# Patient Record
Sex: Female | Born: 1940 | Race: White | Hispanic: No | Marital: Married | Smoking: Former smoker
Health system: Southern US, Community
[De-identification: ages and names within clinical notes are randomized; demographics above are authoritative.]

## PROBLEM LIST (undated history)

## (undated) DIAGNOSIS — J449 Chronic obstructive pulmonary disease, unspecified: Secondary | ICD-10-CM

## (undated) DIAGNOSIS — M810 Age-related osteoporosis without current pathological fracture: Secondary | ICD-10-CM

## (undated) DIAGNOSIS — R9431 Abnormal electrocardiogram [ECG] [EKG]: Secondary | ICD-10-CM

## (undated) HISTORY — DX: Chronic obstructive pulmonary disease, unspecified: J44.9

## (undated) HISTORY — DX: Age-related osteoporosis without current pathological fracture: M81.0

## (undated) HISTORY — DX: Abnormal electrocardiogram (ECG) (EKG): R94.31

## (undated) HISTORY — PX: LUNG SURGERY: SHX703

---

## 2000-10-25 ENCOUNTER — Encounter: Payer: Self-pay | Admitting: Family Medicine

## 2000-10-25 ENCOUNTER — Ambulatory Visit (HOSPITAL_COMMUNITY): Admission: RE | Admit: 2000-10-25 | Discharge: 2000-10-25 | Payer: Self-pay | Admitting: Unknown Physician Specialty

## 2001-03-04 ENCOUNTER — Encounter: Payer: Self-pay | Admitting: Family Medicine

## 2001-03-04 ENCOUNTER — Ambulatory Visit (HOSPITAL_COMMUNITY): Admission: RE | Admit: 2001-03-04 | Discharge: 2001-03-04 | Payer: Self-pay | Admitting: Family Medicine

## 2002-08-14 ENCOUNTER — Encounter: Payer: Self-pay | Admitting: Family Medicine

## 2002-08-14 ENCOUNTER — Ambulatory Visit (HOSPITAL_COMMUNITY): Admission: RE | Admit: 2002-08-14 | Discharge: 2002-08-14 | Payer: Self-pay | Admitting: Family Medicine

## 2003-10-01 ENCOUNTER — Ambulatory Visit (HOSPITAL_COMMUNITY): Admission: RE | Admit: 2003-10-01 | Discharge: 2003-10-01 | Payer: Self-pay | Admitting: Family Medicine

## 2004-02-07 ENCOUNTER — Ambulatory Visit (HOSPITAL_COMMUNITY): Admission: RE | Admit: 2004-02-07 | Discharge: 2004-02-07 | Payer: Self-pay | Admitting: Family Medicine

## 2004-02-13 ENCOUNTER — Ambulatory Visit (HOSPITAL_COMMUNITY): Admission: RE | Admit: 2004-02-13 | Discharge: 2004-02-13 | Payer: Self-pay | Admitting: Family Medicine

## 2004-03-05 ENCOUNTER — Ambulatory Visit (HOSPITAL_COMMUNITY): Admission: RE | Admit: 2004-03-05 | Discharge: 2004-03-05 | Payer: Self-pay | Admitting: Internal Medicine

## 2004-08-27 ENCOUNTER — Ambulatory Visit (HOSPITAL_COMMUNITY): Admission: RE | Admit: 2004-08-27 | Discharge: 2004-08-27 | Payer: Self-pay | Admitting: Family Medicine

## 2004-11-04 ENCOUNTER — Ambulatory Visit: Payer: Self-pay | Admitting: *Deleted

## 2004-11-05 ENCOUNTER — Ambulatory Visit: Payer: Self-pay | Admitting: Cardiology

## 2004-11-05 ENCOUNTER — Ambulatory Visit (HOSPITAL_COMMUNITY): Admission: RE | Admit: 2004-11-05 | Discharge: 2004-11-05 | Payer: Self-pay | Admitting: *Deleted

## 2004-11-12 ENCOUNTER — Ambulatory Visit: Payer: Self-pay | Admitting: *Deleted

## 2005-03-25 ENCOUNTER — Ambulatory Visit (HOSPITAL_COMMUNITY): Admission: RE | Admit: 2005-03-25 | Discharge: 2005-03-25 | Payer: Self-pay | Admitting: Family Medicine

## 2005-05-14 ENCOUNTER — Ambulatory Visit (HOSPITAL_COMMUNITY): Admission: RE | Admit: 2005-05-14 | Discharge: 2005-05-14 | Payer: Self-pay | Admitting: Neurology

## 2006-03-04 ENCOUNTER — Ambulatory Visit: Payer: Self-pay | Admitting: Internal Medicine

## 2006-03-16 ENCOUNTER — Ambulatory Visit (HOSPITAL_COMMUNITY): Admission: RE | Admit: 2006-03-16 | Discharge: 2006-03-16 | Payer: Self-pay | Admitting: Internal Medicine

## 2006-03-16 ENCOUNTER — Ambulatory Visit: Payer: Self-pay | Admitting: Internal Medicine

## 2006-09-20 ENCOUNTER — Ambulatory Visit (HOSPITAL_COMMUNITY): Admission: RE | Admit: 2006-09-20 | Discharge: 2006-09-20 | Payer: Self-pay | Admitting: Family Medicine

## 2006-09-22 ENCOUNTER — Ambulatory Visit (HOSPITAL_COMMUNITY): Admission: RE | Admit: 2006-09-22 | Discharge: 2006-09-22 | Payer: Self-pay | Admitting: Family Medicine

## 2006-10-13 ENCOUNTER — Ambulatory Visit (HOSPITAL_COMMUNITY): Payer: Self-pay | Admitting: Family Medicine

## 2006-10-13 ENCOUNTER — Encounter (HOSPITAL_COMMUNITY): Admission: RE | Admit: 2006-10-13 | Discharge: 2006-11-12 | Payer: Self-pay | Admitting: Family Medicine

## 2007-01-05 ENCOUNTER — Encounter (HOSPITAL_COMMUNITY): Admission: RE | Admit: 2007-01-05 | Discharge: 2007-02-04 | Payer: Self-pay | Admitting: Family Medicine

## 2007-01-05 ENCOUNTER — Ambulatory Visit (HOSPITAL_COMMUNITY): Payer: Self-pay | Admitting: Family Medicine

## 2007-04-06 ENCOUNTER — Ambulatory Visit (HOSPITAL_COMMUNITY): Payer: Self-pay | Admitting: Family Medicine

## 2007-04-06 ENCOUNTER — Encounter (HOSPITAL_COMMUNITY): Admission: RE | Admit: 2007-04-06 | Discharge: 2007-04-12 | Payer: Self-pay | Admitting: Oncology

## 2007-07-04 ENCOUNTER — Ambulatory Visit (HOSPITAL_COMMUNITY): Payer: Self-pay | Admitting: Family Medicine

## 2007-07-04 ENCOUNTER — Encounter (HOSPITAL_COMMUNITY): Admission: RE | Admit: 2007-07-04 | Discharge: 2007-07-13 | Payer: Self-pay | Admitting: Family Medicine

## 2007-10-05 ENCOUNTER — Ambulatory Visit (HOSPITAL_COMMUNITY): Payer: Self-pay | Admitting: Family Medicine

## 2007-10-05 ENCOUNTER — Encounter (HOSPITAL_COMMUNITY): Admission: RE | Admit: 2007-10-05 | Discharge: 2007-11-04 | Payer: Self-pay | Admitting: Family Medicine

## 2007-11-09 ENCOUNTER — Ambulatory Visit (HOSPITAL_COMMUNITY): Admission: RE | Admit: 2007-11-09 | Discharge: 2007-11-09 | Payer: Self-pay | Admitting: Family Medicine

## 2008-01-05 ENCOUNTER — Encounter (HOSPITAL_COMMUNITY): Admission: RE | Admit: 2008-01-05 | Discharge: 2008-02-04 | Payer: Self-pay | Admitting: Oncology

## 2008-01-05 ENCOUNTER — Ambulatory Visit (HOSPITAL_COMMUNITY): Payer: Self-pay | Admitting: Family Medicine

## 2008-02-28 ENCOUNTER — Ambulatory Visit (HOSPITAL_COMMUNITY): Admission: RE | Admit: 2008-02-28 | Discharge: 2008-02-28 | Payer: Self-pay | Admitting: Family Medicine

## 2008-04-05 ENCOUNTER — Encounter (HOSPITAL_COMMUNITY): Admission: RE | Admit: 2008-04-05 | Discharge: 2008-05-05 | Payer: Self-pay | Admitting: Family Medicine

## 2008-04-12 ENCOUNTER — Ambulatory Visit (HOSPITAL_COMMUNITY): Payer: Self-pay | Admitting: Family Medicine

## 2008-08-09 ENCOUNTER — Encounter (HOSPITAL_COMMUNITY): Admission: RE | Admit: 2008-08-09 | Discharge: 2008-09-08 | Payer: Self-pay | Admitting: Family Medicine

## 2008-08-09 ENCOUNTER — Ambulatory Visit (HOSPITAL_COMMUNITY): Payer: Self-pay | Admitting: Family Medicine

## 2008-09-06 ENCOUNTER — Ambulatory Visit (HOSPITAL_COMMUNITY): Admission: RE | Admit: 2008-09-06 | Discharge: 2008-09-06 | Payer: Self-pay | Admitting: Family Medicine

## 2008-10-03 ENCOUNTER — Ambulatory Visit (HOSPITAL_COMMUNITY): Admission: RE | Admit: 2008-10-03 | Discharge: 2008-10-03 | Payer: Self-pay | Admitting: Family Medicine

## 2008-11-08 ENCOUNTER — Ambulatory Visit (HOSPITAL_COMMUNITY): Payer: Self-pay | Admitting: Family Medicine

## 2008-11-08 ENCOUNTER — Encounter (HOSPITAL_COMMUNITY): Admission: RE | Admit: 2008-11-08 | Discharge: 2008-12-08 | Payer: Self-pay | Admitting: Family Medicine

## 2009-01-31 ENCOUNTER — Encounter (HOSPITAL_COMMUNITY): Admission: RE | Admit: 2009-01-31 | Discharge: 2009-03-02 | Payer: Self-pay | Admitting: Family Medicine

## 2009-01-31 ENCOUNTER — Ambulatory Visit (HOSPITAL_COMMUNITY): Payer: Self-pay | Admitting: Family Medicine

## 2009-03-13 ENCOUNTER — Ambulatory Visit (HOSPITAL_COMMUNITY): Admission: RE | Admit: 2009-03-13 | Discharge: 2009-03-13 | Payer: Self-pay | Admitting: Family Medicine

## 2009-05-02 ENCOUNTER — Ambulatory Visit (HOSPITAL_COMMUNITY): Payer: Self-pay | Admitting: Family Medicine

## 2009-05-02 ENCOUNTER — Encounter (HOSPITAL_COMMUNITY): Admission: RE | Admit: 2009-05-02 | Discharge: 2009-06-01 | Payer: Self-pay | Admitting: Family Medicine

## 2009-07-01 ENCOUNTER — Ambulatory Visit (HOSPITAL_COMMUNITY): Admission: RE | Admit: 2009-07-01 | Discharge: 2009-07-01 | Payer: Self-pay | Admitting: Family Medicine

## 2009-08-01 ENCOUNTER — Encounter (HOSPITAL_COMMUNITY): Admission: RE | Admit: 2009-08-01 | Discharge: 2009-08-31 | Payer: Self-pay | Admitting: Family Medicine

## 2009-08-01 ENCOUNTER — Ambulatory Visit (HOSPITAL_COMMUNITY): Payer: Self-pay | Admitting: Family Medicine

## 2009-09-10 ENCOUNTER — Ambulatory Visit (HOSPITAL_COMMUNITY): Admission: RE | Admit: 2009-09-10 | Discharge: 2009-09-10 | Payer: Self-pay | Admitting: Family Medicine

## 2009-10-31 ENCOUNTER — Encounter (HOSPITAL_COMMUNITY): Admission: RE | Admit: 2009-10-31 | Discharge: 2009-11-30 | Payer: Self-pay | Admitting: Family Medicine

## 2009-10-31 ENCOUNTER — Ambulatory Visit (HOSPITAL_COMMUNITY): Payer: Self-pay | Admitting: Family Medicine

## 2009-12-30 ENCOUNTER — Ambulatory Visit (HOSPITAL_COMMUNITY): Admission: RE | Admit: 2009-12-30 | Discharge: 2009-12-30 | Payer: Self-pay | Admitting: Family Medicine

## 2010-01-30 ENCOUNTER — Encounter (HOSPITAL_COMMUNITY): Admission: RE | Admit: 2010-01-30 | Discharge: 2010-03-01 | Payer: Self-pay | Admitting: Family Medicine

## 2010-01-30 ENCOUNTER — Ambulatory Visit (HOSPITAL_COMMUNITY): Payer: Self-pay | Admitting: Family Medicine

## 2010-05-01 ENCOUNTER — Encounter (HOSPITAL_COMMUNITY)
Admission: RE | Admit: 2010-05-01 | Discharge: 2010-05-31 | Payer: Self-pay | Source: Home / Self Care | Admitting: Family Medicine

## 2010-05-01 ENCOUNTER — Ambulatory Visit (HOSPITAL_COMMUNITY): Payer: Self-pay | Admitting: Family Medicine

## 2010-07-08 ENCOUNTER — Encounter: Payer: Self-pay | Admitting: Cardiovascular Disease

## 2010-07-31 ENCOUNTER — Ambulatory Visit (HOSPITAL_COMMUNITY)
Admission: RE | Admit: 2010-07-31 | Discharge: 2010-08-12 | Payer: Self-pay | Source: Home / Self Care | Attending: Family Medicine | Admitting: Family Medicine

## 2010-07-31 ENCOUNTER — Encounter (HOSPITAL_COMMUNITY)
Admission: RE | Admit: 2010-07-31 | Discharge: 2010-08-12 | Payer: Self-pay | Source: Home / Self Care | Attending: Family Medicine | Admitting: Family Medicine

## 2010-08-02 ENCOUNTER — Encounter (INDEPENDENT_AMBULATORY_CARE_PROVIDER_SITE_OTHER): Payer: Self-pay | Admitting: Internal Medicine

## 2010-10-28 ENCOUNTER — Other Ambulatory Visit: Payer: Self-pay | Admitting: *Deleted

## 2010-10-28 ENCOUNTER — Other Ambulatory Visit (HOSPITAL_COMMUNITY): Payer: Self-pay | Admitting: Family Medicine

## 2010-10-28 ENCOUNTER — Ambulatory Visit (HOSPITAL_COMMUNITY)
Admission: RE | Admit: 2010-10-28 | Discharge: 2010-10-28 | Disposition: A | Discharge status: Home / Self Care | Payer: Medicare Other | Source: Ambulatory Visit | Attending: Family Medicine | Admitting: Family Medicine

## 2010-10-28 DIAGNOSIS — J449 Chronic obstructive pulmonary disease, unspecified: Secondary | ICD-10-CM | POA: Insufficient documentation

## 2010-10-28 DIAGNOSIS — J4489 Other specified chronic obstructive pulmonary disease: Secondary | ICD-10-CM | POA: Insufficient documentation

## 2010-10-28 DIAGNOSIS — J40 Bronchitis, not specified as acute or chronic: Secondary | ICD-10-CM

## 2010-10-28 DIAGNOSIS — R0602 Shortness of breath: Secondary | ICD-10-CM | POA: Insufficient documentation

## 2010-10-28 LAB — BLOOD GAS, ARTERIAL
Acid-Base Excess: 1.1 mmol/L (ref 0.0–2.0)
Bicarbonate: 25 mEq/L — ABNORMAL HIGH (ref 20.0–24.0)
Patient temperature: 37
TCO2: 21.6 mmol/L (ref 0–100)
pCO2 arterial: 37.9 mmHg (ref 35.0–45.0)
pO2, Arterial: 68.1 mmHg — ABNORMAL LOW (ref 80.0–100.0)

## 2010-10-30 ENCOUNTER — Ambulatory Visit (HOSPITAL_COMMUNITY): Payer: Medicare Other

## 2010-10-30 ENCOUNTER — Other Ambulatory Visit (HOSPITAL_COMMUNITY): Payer: Self-pay | Admitting: Family Medicine

## 2010-10-30 ENCOUNTER — Encounter (HOSPITAL_COMMUNITY): Payer: Medicare Other

## 2010-10-30 DIAGNOSIS — R05 Cough: Secondary | ICD-10-CM

## 2010-10-30 DIAGNOSIS — J449 Chronic obstructive pulmonary disease, unspecified: Secondary | ICD-10-CM

## 2010-11-06 ENCOUNTER — Encounter (HOSPITAL_COMMUNITY): Payer: Medicare Other | Attending: Family Medicine

## 2010-11-06 ENCOUNTER — Encounter (HOSPITAL_COMMUNITY): Payer: Medicare Other

## 2010-11-06 DIAGNOSIS — M818 Other osteoporosis without current pathological fracture: Secondary | ICD-10-CM | POA: Insufficient documentation

## 2010-11-07 ENCOUNTER — Ambulatory Visit (HOSPITAL_COMMUNITY)
Admission: RE | Admit: 2010-11-07 | Discharge: 2010-11-07 | Disposition: A | Discharge status: Home / Self Care | Payer: Medicare Other | Source: Ambulatory Visit | Attending: Family Medicine | Admitting: Family Medicine

## 2010-11-07 DIAGNOSIS — J4489 Other specified chronic obstructive pulmonary disease: Secondary | ICD-10-CM | POA: Insufficient documentation

## 2010-11-07 DIAGNOSIS — R05 Cough: Secondary | ICD-10-CM | POA: Insufficient documentation

## 2010-11-07 DIAGNOSIS — R059 Cough, unspecified: Secondary | ICD-10-CM | POA: Insufficient documentation

## 2010-11-07 DIAGNOSIS — J449 Chronic obstructive pulmonary disease, unspecified: Secondary | ICD-10-CM

## 2010-11-13 ENCOUNTER — Inpatient Hospital Stay (HOSPITAL_COMMUNITY)
Admission: RE | Admit: 2010-11-13 | Discharge: 2010-11-13 | Disposition: A | Discharge status: Home / Self Care | Payer: Medicare Other | Source: Ambulatory Visit | Attending: Family Medicine | Admitting: Family Medicine

## 2010-11-13 ENCOUNTER — Encounter: Payer: Self-pay | Admitting: Cardiovascular Disease

## 2010-11-17 ENCOUNTER — Encounter: Payer: Self-pay | Admitting: Cardiovascular Disease

## 2010-11-20 ENCOUNTER — Encounter: Payer: Self-pay | Admitting: Cardiovascular Disease

## 2010-11-21 ENCOUNTER — Encounter: Payer: Self-pay | Admitting: Cardiovascular Disease

## 2010-11-21 ENCOUNTER — Ambulatory Visit (INDEPENDENT_AMBULATORY_CARE_PROVIDER_SITE_OTHER): Payer: Medicare Other | Admitting: Cardiovascular Disease

## 2010-11-21 DIAGNOSIS — R9431 Abnormal electrocardiogram [ECG] [EKG]: Secondary | ICD-10-CM | POA: Insufficient documentation

## 2010-11-21 DIAGNOSIS — R0600 Dyspnea, unspecified: Secondary | ICD-10-CM

## 2010-11-21 DIAGNOSIS — R079 Chest pain, unspecified: Secondary | ICD-10-CM | POA: Insufficient documentation

## 2010-11-21 DIAGNOSIS — R0989 Other specified symptoms and signs involving the circulatory and respiratory systems: Secondary | ICD-10-CM

## 2010-11-21 NOTE — Patient Instructions (Signed)
Your physician has requested that you have an echocardiogram. Echocardiography is a painless test that uses sound waves to create images of your heart. It provides your doctor with information about the size and shape of your heart and how well your heart's chambers and valves are working. This procedure takes approximately one hour. There are no restrictions for this procedure.SCHEDULE AT [facility]   Your physician has requested that you have a lexiscan myoview. For further information please visit [email or web address] Please follow instruction sheet, as given.SCHEDULE AT [facility]

## 2010-11-21 NOTE — Assessment & Plan Note (Signed)
Progressive COPD likely etiology of symptoms.  R/O pulmonary htn with echo.  She has expressed interest in DNR and never being intubated.

## 2010-11-21 NOTE — Progress Notes (Signed)
70 yo with progressive dyspnea over the last year.  Severe emphysema with progression of parenchymal lung disease by recent CT.  Two episodes of SSCP at rest last two weeks.  Lasted about 10 minutes and associated with dyspnea.  Has not had previous cardiac disease or evidence of pulmonary HTN.  Has had cough but no fever or sputum.  No PND or othopnea and no edema or signs of CHF.  Does not feel that her oxygen or lung medicine is working as well.  Needs 3L/min with ambulation and wearing oxygen all day and night now.    ROS: Denies fever, malais, weight loss, blurry vision, decreased visual acuity, cough, sputum, SOB, hemoptysis, pleuritic pain, palpitaitons, heartburn, abdominal pain, melena, lower extremity edema, claudication, or rash.   General: Affect appropriate Healthy:  appears stated age HEENT: normal Neck supple with no adenopathy JVP normal no bruits no thyromegaly Lungs clear with no wheezing and good diaphragmatic motion Heart:  S1/S2 no murmur,rub, gallop or click PMI normal Abdomen: benighn, BS positve, no tenderness, no AAA no bruit.  No HSM or HJR Distal pulses intact with no bruits No edema Neuro non-focal Skin warm and dry No muscular weakness  Medications Current Outpatient Prescriptions  Medication Sig Dispense Refill  . Acetaminophen-Pamabrom (WOMENS TYLENOL PO) Take by mouth as needed.        Marland Kitchen albuterol (PROVENTIL) 90 MCG/ACT inhaler Inhale 2 puffs into the lungs.        . ALPRAZolam (XANAX) 0.5 MG tablet Take 0.5 mg by mouth at bedtime as needed.        Marland Kitchen atorvastatin (LIPITOR) 10 MG tablet Take 10 mg by mouth daily.        . budesonide-formoterol (SYMBICORT) 160-4.5 MCG/ACT inhaler Inhale 2 puffs into the lungs 2 (two) times daily.        . Calcium Carbonate (CALTRATE 600 PO) Take by mouth as needed.        . citalopram (CELEXA) 20 MG tablet 1/2 tab po qd       . cyclobenzaprine (FLEXERIL) 10 MG tablet Take 5 mg by mouth at bedtime as needed.        .  Ibandronate Sodium (BONIVA IV) Inject into the vein every 3 (three) months.        . magnesium oxide (MAG-OX) 400 MG tablet Take 400 mg by mouth daily.        . Omeprazole Magnesium (PRILOSEC OTC PO) 1 tab po qd      . tiotropium (SPIRIVA) 18 MCG inhalation capsule Place 18 mcg into inhaler and inhale daily.          Allergies Hydrocodone; Suprax; Triptans; and Zocor  Family History: Family History  Problem Relation Age of Onset  . Colon cancer      grandma  . Hypertension Mother     Social History: History   Social History  . Marital Status: Married    Spouse Name: N/A    Number of Children: 4  . Years of Education: N/A   Occupational History  . Not on file.   Social History Main Topics  . Smoking status: Former Games developer  . Smokeless tobacco: Never Used  . Alcohol Use: No  . Drug Use: No  . Sexually Active: Not on file   Other Topics Concern  . Not on file   Social History Narrative  . No narrative on file    Electrocardiogram:  NSR IVCD LAD  Assessment and Plan

## 2010-11-21 NOTE — Assessment & Plan Note (Signed)
Not likely angina.  F/U [employer]

## 2010-11-21 NOTE — Assessment & Plan Note (Signed)
Check echo.  Doubt ischemia.  No significant heart block

## 2010-11-24 ENCOUNTER — Other Ambulatory Visit: Payer: Self-pay | Admitting: Cardiovascular Disease

## 2010-11-25 ENCOUNTER — Encounter (HOSPITAL_COMMUNITY): Payer: Self-pay

## 2010-11-25 ENCOUNTER — Encounter (HOSPITAL_COMMUNITY): Payer: Medicare Other

## 2010-11-25 ENCOUNTER — Encounter (HOSPITAL_COMMUNITY)
Admission: RE | Admit: 2010-11-25 | Discharge: 2010-11-25 | Disposition: A | Discharge status: Home / Self Care | Payer: Medicare Other | Source: Ambulatory Visit | Attending: Cardiovascular Disease | Admitting: Cardiovascular Disease

## 2010-11-25 ENCOUNTER — Ambulatory Visit (INDEPENDENT_AMBULATORY_CARE_PROVIDER_SITE_OTHER): Payer: Medicare Other | Admitting: *Deleted

## 2010-11-25 ENCOUNTER — Ambulatory Visit (HOSPITAL_COMMUNITY)
Admission: RE | Admit: 2010-11-25 | Discharge: 2010-11-25 | Disposition: A | Discharge status: Home / Self Care | Payer: Medicare Other | Source: Ambulatory Visit | Attending: Cardiovascular Disease | Admitting: Cardiovascular Disease

## 2010-11-25 DIAGNOSIS — R079 Chest pain, unspecified: Secondary | ICD-10-CM

## 2010-11-25 DIAGNOSIS — R0989 Other specified symptoms and signs involving the circulatory and respiratory systems: Secondary | ICD-10-CM | POA: Insufficient documentation

## 2010-11-25 DIAGNOSIS — R072 Precordial pain: Secondary | ICD-10-CM

## 2010-11-25 DIAGNOSIS — R0609 Other forms of dyspnea: Secondary | ICD-10-CM | POA: Insufficient documentation

## 2010-11-25 DIAGNOSIS — Z87891 Personal history of nicotine dependence: Secondary | ICD-10-CM | POA: Insufficient documentation

## 2010-11-25 MED ORDER — TECHNETIUM TC 99M TETROFOSMIN IV KIT
10.0000 | PACK | Freq: Once | INTRAVENOUS | Status: AC | PRN
  Administered 2010-11-25: 10.2 via INTRAVENOUS

## 2010-11-25 MED ORDER — TECHNETIUM TC 99M TETROFOSMIN IV KIT
30.0000 | PACK | Freq: Once | INTRAVENOUS | Status: AC | PRN
  Administered 2010-11-25: 30 via INTRAVENOUS

## 2010-11-25 NOTE — Progress Notes (Deleted)

## 2010-11-26 NOTE — Progress Notes (Signed)
Please see dictated report for Stress Echocardiogram.

## 2010-11-28 NOTE — Procedures (Signed)
NAME:  Anita Hanson, Anita Hanson                 ACCOUNT NO.:  [id number]   MEDICAL RECORD NO.:  [id number]          PATIENT TYPE:  OUT   LOCATION:  RAD                           FACILITY:  [facility]   PHYSICIAN:  [place] Bing, M.D.  DATE OF BIRTH:  February 27, 1941   DATE OF PROCEDURE:  DATE OF DISCHARGE:                                  ECHOCARDIOGRAM   CONSULTING PHYSICIAN:  [place] Bing, M.D.   REFERRING PHYSICIAN:  Scott A. Gerda Diss, M.D.   CLINICAL DATA:  A 70 year old woman with chest pain, and murmur; history of  COPD.   Aorta 2.6, left atrium 3.7, septum 0.9, posterior wall 0.8, LV diastole 2.8,  LV systole 2.1, RV diastole 3.3.   1.  Technically adequate echocardiographic study.  2.  Normal left atrium, right atrium, and right ventricle.  The mitral valve      is slightly thickened and appears somewhat restrained but there is no      appreciable mitral stenosis and trivial mitral regurgitation.  3.  Normal aortic valve.  4.  Normal tricuspid valve.  5.  Normal pulmonic valve and proximal pulmonary artery.  6.  Normal internal dimension, wall thickness, regional and global function      of the left ventricle.  7.  Normal IVC.  8.  Physiologic pericardial effusion.      RR/MEDQ  D:  11/05/2004  T:  11/05/2004  Job:  [id number]

## 2010-11-28 NOTE — Consult Note (Signed)
NAME:  Anita Hanson, Anita Hanson                 ACCOUNT NO.:  [id number]   MEDICAL RECORD NO.:  [id number]           PATIENT TYPE:  AMB   LOCATION:                                FACILITY:  [facility]   PHYSICIAN:  Lionel December, M.D.    DATE OF BIRTH:  [birth year]   DATE OF CONSULTATION:  03/04/2006  DATE OF DISCHARGE:                                   CONSULTATION   PRESENTING COMPLAINT:  Dysphagia.   HISTORY OF PRESENT ILLNESS:  Anita Hanson is a 70 year old Caucasian female who is  referred through courtesy of Dr. Lilyan Punt for GI evaluation.  She  presented with 52-month history of dysphagia.  She has difficulty both with  solids as well as liquids.  She feels food gets at lodged at mid sternal  area and eventually goes down, and she has some discomfort.  This symptom  does not occur every day.  She also complains of getting strangled while she  is talking.  This is followed by coughing spells.  She may also have this  symptom with liquids.  She denies nausea, vomiting, hematemesis, melena or  rectal bleeding.  She states she has heartburn maybe once or twice a week.  She has noted decrease in frequency since she has been on Protonix.  Her  last EGD was in August 2005 which was within normal limits.  At that time,  she was not complaining of dysphagia, and the test was primarily done for  weight loss, anorexia and early satiety.  At that time, she also had a  colonoscopy which was incomplete.  She had sigmoid colon diverticulosis.  She has been scheduled for barium enema on a couple of occasions but had to  be cancelled or postponed because of respiratory problems.  She has not had  it to date.  She has a good appetite.  She is somewhat concerned about her  weight gain.  She feels most of it occurred after she quit cigarette  smoking.  She also complains of intermittent diarrhea; some days, she may  have four stools today.  She also has difficulty keeping area clean.  She  complains of dyspnea with  minimal exertion.  She is using nasal O2 on p.r.n.  basis.   MEDICATIONS:  Other medications are:  1. Protonix 40 mg q.a.m.  2. Calcium 1 g q.d.  3. Xanax 0.25 mg q.h.s.  4. Albuterol neb q.4h.  5. Tylenol p.r.n.  6. Spiriva 1 puff daily, Proventil inhaler 2 puffs q.i.d. p.r.n.  7. Hydrocodone 5/500 q.4h. p.r.n.   PAST MEDICAL HISTORY:  She has severe chronic obstructive pulmonary  disease/chronic bronchitis, history of depression, anxiety and migraines for  which she uses hydrocodone, chronic GERD - symptoms fairly well controlled  with anti-reflux measures and PPI.  Prior surgeries include tonsillectomy,  tubal ligation, appendectomy and she also had part of her left lung removed  for pneumonia complicated by abscess.   ALLERGIES:  PENICILLIN AND ZOCOR.   FAMILY HISTORY:  Positive for colon carcinoma in a maternal uncle but  negative in first-degree relatives.  SOCIAL HISTORY:  She is married.  She has four children.  She is retired.  She smoked cigarettes for several years, finally quit last year.   OBJECTIVE:  VITAL SIGNS:  Weight 122-1/2 pounds.  She is 5 feet 2 inches  tall.  Pulse 76 per minute, blood pressure 122/68, temperature is 97.7.  HEENT:  Conjunctivae is pink.  Sclerae is nonicteric.  Oral pharyngeal  mucosa is normal.  She has dentures.  NECK:  No neck masses or thyromegaly noted.  CARDIAC:  Exam with regular rhythm.  Normal S1-S2.  No murmur or gallop  noted.  LUNGS:  Auscultation of lungs revealed diminished anterior breath sounds  bilaterally.  ABDOMEN:  Her abdomen is soft and nontender without organomegaly or masses.  EXTREMITIES:  No clubbing or peripheral edema noted.   ASSESSMENT:  1. Anita Hanson has dysphagia.  She appears to have esophageal dysphagia as well      as pharyngeal symptoms.  I suspect that these are two separate process,      although she could have web high up in her esophagus resulting in      throat symptom.  It remains to be seen  whether or not she would have      complete resolution of her symptoms with esophageal dilation.  2. Intermittent diarrhea.  I suspect this may be due to her      bronchodilators.  She could also have IBS.   RECOMMENDATIONS:  Diagnostic esophagogastroduodenoscopy with possible  esophageal dilation to be performed at [facility] in the future.  I have reviewed  the procedure risks with the patient, and she is agreeable.  Further  recommendations will depend on endoscopic findings.   We would like to thank Dr. Lilyan Punt for the opportunity to participate  in the care of this nice lady.      Lionel December, M.D.  Electronically Signed     NR/MEDQ  D:  03/04/2006  T:  03/05/2006  Job:  [id number]   cc:   Lorin Picket A. Gerda Diss, MD  Fax: [phone]   [facility]  Fax: [phone]

## 2010-11-28 NOTE — Op Note (Signed)
NAME:  Anita Hanson, Anita Hanson                 ACCOUNT NO.:  [id number]   MEDICAL RECORD NO.:  [id number]          PATIENT TYPE:  AMB   LOCATION:  DAY                           FACILITY:  [facility]   PHYSICIAN:  Lionel December, M.D.    DATE OF BIRTH:  [birth year]   DATE OF PROCEDURE:  03/16/2006  DATE OF DISCHARGE:                                 OPERATIVE REPORT   PROCEDURE:  Esophagogastroduodenoscopy with esophageal dilation.   INDICATIONS FOR PROCEDURE:  [name] is a 70 year old Caucasian female who  presents with dysphagia. She appears to have both esophageal and oral  pharyngeal dysphagia. Today she is being evaluated for esophageal  symptomatology. The procedure was reviewed with the patient and informed  consent was obtained.   MEDS FOR CONSCIOUS SEDATION:  Benzocaine spray for pharyngeal topical  anesthesia, Demerol 20 mg IV, Versed 2 mg IV.   FINDINGS:  The procedure performed in endoscopy suite. The patient's vital  signs and O2 sat were monitored during the procedure and remained stable.  The patient was maintained on low flow O2 and her O2 sat remained well above  90%. The patient was placed in the left lateral decubitus position, the  Olympus video scope was passed through the oropharynx without any difficulty  into the esophagus.   ESOPHAGUS:  The mucosa of the esophagus was normal. The GE junction was at  38 cm from the incisors. No ring or stricture was noted.   STOMACH:  It was empty and distended very well with insufflation. The folds  of the proximal stomach were normal. Examination of the mucosa at the body,  antrum and pyloric channel as well as angularis, fundus and cardia was  normal.   DUODENUM:  The bulbar mucosa was normal. The scope was passed to the second  part of the duodenum where the mucosa and folds were normal. The endoscope  was withdrawn.   The esophagus was dilated in particular with passing a 56-French Maloney  dilator for insertion. As the dilator was  withdrawn, the endoscope was  passed again and the esophageal mucosa reexamined and there was no  disruption noted. The endoscope was withdrawn. The patient tolerated the  procedure well.   FINAL DIAGNOSES:  1. Normal esophagogastroduodenoscopy.  2. Esophagus empirically dilated by passing 56 [place] Maloney dilator.   RECOMMENDATIONS:  1. She will resume her usual medications.  2. She will call us with __________ support in one week. Unless she      improves, she will need further evaluation.      Lionel December, M.D.  Electronically Signed     NR/MEDQ  D:  03/16/2006  T:  03/16/2006  Job:  [id number]   cc:   Lorin Picket A. Gerda Diss, MD  Fax: [phone]

## 2011-01-29 ENCOUNTER — Encounter (HOSPITAL_COMMUNITY): Payer: Medicare Other | Attending: Oncology

## 2011-01-29 DIAGNOSIS — M818 Other osteoporosis without current pathological fracture: Secondary | ICD-10-CM | POA: Insufficient documentation

## 2011-01-29 MED ORDER — IBANDRONATE SODIUM 3 MG/3ML IV SOLN
3.0000 mg | Freq: Once | INTRAVENOUS | Status: AC
  Administered 2011-01-29: 3 mg via INTRAVENOUS

## 2011-01-29 MED ORDER — IBANDRONATE SODIUM 3 MG/3ML IV SOLN
INTRAVENOUS | Status: AC
  Administered 2011-01-29: 3 mg via INTRAVENOUS
  Filled 2011-01-29: qty 3

## 2011-01-29 MED ORDER — SODIUM CHLORIDE 0.9 % IJ SOLN
INTRAMUSCULAR | Status: AC
  Administered 2011-01-29: 10 mL
  Filled 2011-01-29: qty 10

## 2011-05-01 ENCOUNTER — Encounter (HOSPITAL_COMMUNITY): Payer: Medicare Other | Attending: Family Medicine

## 2011-05-01 DIAGNOSIS — M818 Other osteoporosis without current pathological fracture: Secondary | ICD-10-CM | POA: Insufficient documentation

## 2011-05-01 MED ORDER — SODIUM CHLORIDE 0.9 % IJ SOLN
INTRAMUSCULAR | Status: AC
  Administered 2011-05-01: 10 mL
  Filled 2011-05-01: qty 10

## 2011-05-01 MED ORDER — IBANDRONATE SODIUM 3 MG/3ML IV SOLN
INTRAVENOUS | Status: AC
  Administered 2011-05-01: 3 mg
  Filled 2011-05-01: qty 3

## 2011-05-01 NOTE — Progress Notes (Signed)
Boniva 3mg  given IV in rt ac. IV site wnl. Pt tolerated well.

## 2011-07-23 ENCOUNTER — Encounter (HOSPITAL_COMMUNITY): Payer: Self-pay

## 2011-07-23 ENCOUNTER — Encounter (HOSPITAL_COMMUNITY): Payer: Medicare Other | Attending: Family Medicine

## 2011-07-23 DIAGNOSIS — M810 Age-related osteoporosis without current pathological fracture: Secondary | ICD-10-CM | POA: Insufficient documentation

## 2011-07-23 MED ORDER — IBANDRONATE SODIUM 3 MG/3ML IV SOLN
3.0000 mg | Freq: Once | INTRAVENOUS | Status: AC
  Administered 2011-07-23: 3 mg via INTRAVENOUS

## 2011-07-23 NOTE — Progress Notes (Signed)
Tolerated Boniva 3 mg injection well.  To return in 3 months.

## 2011-10-21 ENCOUNTER — Ambulatory Visit (HOSPITAL_COMMUNITY): Payer: Medicare Other

## 2011-10-28 ENCOUNTER — Encounter (HOSPITAL_COMMUNITY): Payer: Medicare Other | Attending: Family Medicine

## 2011-10-28 DIAGNOSIS — M810 Age-related osteoporosis without current pathological fracture: Secondary | ICD-10-CM | POA: Insufficient documentation

## 2011-10-28 MED ORDER — SODIUM CHLORIDE 0.9 % IJ SOLN
10.0000 mL | INTRAMUSCULAR | Status: DC | PRN
  Administered 2011-10-28: 10 mL via INTRAVENOUS

## 2011-10-28 MED ORDER — IBANDRONATE SODIUM 3 MG/3ML IV SOLN
3.0000 mg | Freq: Once | INTRAVENOUS | Status: AC
  Administered 2011-10-28: 3 mg via INTRAVENOUS

## 2011-10-28 NOTE — Progress Notes (Signed)
Tolerated boniva well.

## 2012-01-27 ENCOUNTER — Ambulatory Visit (HOSPITAL_COMMUNITY): Payer: Medicare Other

## 2012-01-28 ENCOUNTER — Encounter (HOSPITAL_COMMUNITY): Payer: Medicare Other | Attending: Family Medicine

## 2012-01-28 DIAGNOSIS — M810 Age-related osteoporosis without current pathological fracture: Secondary | ICD-10-CM | POA: Insufficient documentation

## 2012-01-28 MED ORDER — IBANDRONATE SODIUM 3 MG/3ML IV SOLN
3.0000 mg | Freq: Once | INTRAVENOUS | Status: AC
  Administered 2012-01-28: 3 mg via INTRAVENOUS

## 2012-01-28 NOTE — Progress Notes (Signed)
IV push tolerated well.  No active bleeding upon needle removal, bandage applied.

## 2012-04-29 ENCOUNTER — Encounter (HOSPITAL_COMMUNITY): Payer: Medicare Other | Attending: Family Medicine

## 2012-04-29 ENCOUNTER — Encounter (HOSPITAL_COMMUNITY): Payer: Self-pay

## 2012-04-29 VITALS — BP 131/81 | HR 82 | Temp 97.5°F | Resp 16

## 2012-04-29 DIAGNOSIS — M810 Age-related osteoporosis without current pathological fracture: Secondary | ICD-10-CM | POA: Insufficient documentation

## 2012-04-29 HISTORY — DX: Age-related osteoporosis without current pathological fracture: M81.0

## 2012-04-29 MED ORDER — SODIUM CHLORIDE 0.9 % IJ SOLN
10.0000 mL | INTRAMUSCULAR | Status: DC | PRN
  Administered 2012-04-29: 10 mL via INTRAVENOUS

## 2012-04-29 MED ORDER — IBANDRONATE SODIUM 3 MG/3ML IV SOLN
3.0000 mg | Freq: Once | INTRAVENOUS | Status: AC
  Administered 2012-04-29: 3 mg via INTRAVENOUS

## 2012-04-29 MED ORDER — SODIUM CHLORIDE 0.9 % IJ SOLN: 10.0000 mL | INTRAMUSCULAR | Status: DC | PRN

## 2012-04-29 NOTE — Progress Notes (Signed)
Tolerated boniva IV well.

## 2012-07-29 ENCOUNTER — Encounter (HOSPITAL_COMMUNITY): Payer: Medicare Other | Attending: Family Medicine

## 2012-07-29 VITALS — BP 117/76 | HR 76 | Temp 97.3°F | Resp 16

## 2012-07-29 DIAGNOSIS — M810 Age-related osteoporosis without current pathological fracture: Secondary | ICD-10-CM | POA: Insufficient documentation

## 2012-07-29 MED ORDER — IBANDRONATE SODIUM 3 MG/3ML IV SOLN
3.0000 mg | Freq: Once | INTRAVENOUS | Status: AC
  Administered 2012-07-29: 3 mg via INTRAVENOUS

## 2012-07-29 NOTE — Progress Notes (Signed)
Boniva 3mg /ml given iv push to right AC. NS 20 ml given IV before and after boniva. Tolerated well.

## 2012-08-02 ENCOUNTER — Ambulatory Visit (HOSPITAL_COMMUNITY)
Admission: RE | Admit: 2012-08-02 | Discharge: 2012-08-02 | Disposition: A | Discharge status: Home / Self Care | Payer: Medicare Other | Source: Ambulatory Visit | Attending: Family Medicine | Admitting: Family Medicine

## 2012-08-02 DIAGNOSIS — R0602 Shortness of breath: Secondary | ICD-10-CM | POA: Insufficient documentation

## 2012-08-02 LAB — BLOOD GAS, ARTERIAL
O2 Content: 2 L/min
O2 Saturation: 92.4 %
pCO2 arterial: 41.2 mmHg (ref 35.0–45.0)
pH, Arterial: 7.394 (ref 7.350–7.450)
pO2, Arterial: 63.6 mmHg — ABNORMAL LOW (ref 80.0–100.0)

## 2012-09-21 ENCOUNTER — Emergency Department (HOSPITAL_COMMUNITY): Payer: Medicare Other

## 2012-09-21 ENCOUNTER — Inpatient Hospital Stay (HOSPITAL_COMMUNITY)
Admission: EM | Admit: 2012-09-21 | Discharge: 2012-09-24 | DRG: 871 | Disposition: A | Discharge status: Home Health | Payer: Medicare Other | Attending: Internal Medicine | Admitting: Internal Medicine

## 2012-09-21 ENCOUNTER — Encounter (HOSPITAL_COMMUNITY): Payer: Self-pay

## 2012-09-21 DIAGNOSIS — M5430 Sciatica, unspecified side: Secondary | ICD-10-CM | POA: Diagnosis present

## 2012-09-21 DIAGNOSIS — R9431 Abnormal electrocardiogram [ECG] [EKG]: Secondary | ICD-10-CM

## 2012-09-21 DIAGNOSIS — A419 Sepsis, unspecified organism: Secondary | ICD-10-CM | POA: Diagnosis present

## 2012-09-21 DIAGNOSIS — R0600 Dyspnea, unspecified: Secondary | ICD-10-CM

## 2012-09-21 DIAGNOSIS — R6510 Systemic inflammatory response syndrome (SIRS) of non-infectious origin without acute organ dysfunction: Secondary | ICD-10-CM | POA: Diagnosis present

## 2012-09-21 DIAGNOSIS — M810 Age-related osteoporosis without current pathological fracture: Secondary | ICD-10-CM | POA: Diagnosis present

## 2012-09-21 DIAGNOSIS — K219 Gastro-esophageal reflux disease without esophagitis: Secondary | ICD-10-CM | POA: Diagnosis present

## 2012-09-21 DIAGNOSIS — E785 Hyperlipidemia, unspecified: Secondary | ICD-10-CM | POA: Diagnosis present

## 2012-09-21 DIAGNOSIS — J189 Pneumonia, unspecified organism: Secondary | ICD-10-CM | POA: Diagnosis present

## 2012-09-21 DIAGNOSIS — R1310 Dysphagia, unspecified: Secondary | ICD-10-CM | POA: Diagnosis present

## 2012-09-21 DIAGNOSIS — Z9981 Dependence on supplemental oxygen: Secondary | ICD-10-CM

## 2012-09-21 DIAGNOSIS — Z885 Allergy status to narcotic agent status: Secondary | ICD-10-CM

## 2012-09-21 DIAGNOSIS — Z880 Allergy status to penicillin: Secondary | ICD-10-CM

## 2012-09-21 DIAGNOSIS — R0989 Other specified symptoms and signs involving the circulatory and respiratory systems: Secondary | ICD-10-CM

## 2012-09-21 DIAGNOSIS — R51 Headache: Secondary | ICD-10-CM | POA: Diagnosis present

## 2012-09-21 DIAGNOSIS — J441 Chronic obstructive pulmonary disease with (acute) exacerbation: Secondary | ICD-10-CM | POA: Diagnosis present

## 2012-09-21 DIAGNOSIS — Z888 Allergy status to other drugs, medicaments and biological substances status: Secondary | ICD-10-CM

## 2012-09-21 DIAGNOSIS — Z87891 Personal history of nicotine dependence: Secondary | ICD-10-CM

## 2012-09-21 DIAGNOSIS — B370 Candidal stomatitis: Secondary | ICD-10-CM | POA: Diagnosis present

## 2012-09-21 DIAGNOSIS — Z66 Do not resuscitate: Secondary | ICD-10-CM | POA: Diagnosis present

## 2012-09-21 DIAGNOSIS — Z79899 Other long term (current) drug therapy: Secondary | ICD-10-CM

## 2012-09-21 DIAGNOSIS — J9620 Acute and chronic respiratory failure, unspecified whether with hypoxia or hypercapnia: Secondary | ICD-10-CM | POA: Diagnosis present

## 2012-09-21 LAB — BLOOD GAS, ARTERIAL
Acid-Base Excess: 1 mmol/L (ref 0.0–2.0)
Drawn by: 23534
O2 Content: 2 L/min
O2 Saturation: 93.4 %
Patient temperature: 37
pCO2 arterial: 34.4 mmHg — ABNORMAL LOW (ref 35.0–45.0)

## 2012-09-21 LAB — CBC
Hemoglobin: 13 g/dL (ref 12.0–15.0)
MCH: 30.7 pg (ref 26.0–34.0)
MCHC: 33.5 g/dL (ref 30.0–36.0)
MCV: 91.5 fL (ref 78.0–100.0)
Platelets: 273 10*3/uL (ref 150–400)
RBC: 4.62 MIL/uL (ref 3.87–5.11)
RBC: 4.24 MIL/uL (ref 3.87–5.11)
WBC: 19.3 10*3/uL — ABNORMAL HIGH (ref 4.0–10.5)

## 2012-09-21 LAB — URINALYSIS, ROUTINE W REFLEX MICROSCOPIC: Urobilinogen, UA: 0.2 mg/dL (ref 0.0–1.0)

## 2012-09-21 LAB — URINE MICROSCOPIC-ADD ON

## 2012-09-21 LAB — BASIC METABOLIC PANEL
CO2: 27 mEq/L (ref 19–32)
Calcium: 9.7 mg/dL (ref 8.4–10.5)
Chloride: 97 mEq/L (ref 96–112)
Sodium: 135 mEq/L (ref 135–145)

## 2012-09-21 LAB — TROPONIN I: Troponin I: <0.3 ng/mL (ref <0.30)

## 2012-09-21 LAB — PRO B NATRIURETIC PEPTIDE: Pro B Natriuretic peptide (BNP): 101.7 pg/mL (ref 0–125)

## 2012-09-21 MED ORDER — LEVOFLOXACIN IN D5W 750 MG/150ML IV SOLN
750.0000 mg | INTRAVENOUS | Status: DC
  Administered 2012-09-22 – 2012-09-24 (×3): 750 mg via INTRAVENOUS
  Filled 2012-09-21 (×4): qty 150

## 2012-09-21 MED ORDER — SODIUM CHLORIDE 0.9 % IV SOLN
INTRAVENOUS | Status: DC
  Administered 2012-09-21: 16:00:00 via INTRAVENOUS

## 2012-09-21 MED ORDER — ALBUTEROL (5 MG/ML) CONTINUOUS INHALATION SOLN
10.0000 mg/h | INHALATION_SOLUTION | Freq: Once | RESPIRATORY_TRACT | Status: AC
  Administered 2012-09-21: 10 mg/h via RESPIRATORY_TRACT
  Filled 2012-09-21: qty 20

## 2012-09-21 MED ORDER — ALPRAZOLAM 0.5 MG PO TABS
0.5000 mg | ORAL_TABLET | Freq: Every evening | ORAL | Status: DC | PRN
  Administered 2012-09-21 – 2012-09-23 (×3): 0.5 mg via ORAL
  Filled 2012-09-21 (×3): qty 1

## 2012-09-21 MED ORDER — PANTOPRAZOLE SODIUM 40 MG PO TBEC
40.0000 mg | DELAYED_RELEASE_TABLET | Freq: Every day | ORAL | Status: DC
  Administered 2012-09-21 – 2012-09-24 (×4): 40 mg via ORAL
  Filled 2012-09-21 (×4): qty 1

## 2012-09-21 MED ORDER — ATORVASTATIN CALCIUM 10 MG PO TABS
10.0000 mg | ORAL_TABLET | Freq: Every day | ORAL | Status: DC
  Administered 2012-09-21 – 2012-09-23 (×3): 10 mg via ORAL
  Filled 2012-09-21 (×3): qty 1

## 2012-09-21 MED ORDER — IPRATROPIUM BROMIDE 0.02 % IN SOLN
0.5000 mg | Freq: Four times a day (QID) | RESPIRATORY_TRACT | Status: DC
  Administered 2012-09-21 – 2012-09-23 (×8): 0.5 mg via RESPIRATORY_TRACT
  Filled 2012-09-21 (×9): qty 2.5

## 2012-09-21 MED ORDER — SODIUM CHLORIDE 0.9 % IV SOLN
750.0000 mg | Freq: Two times a day (BID) | INTRAVENOUS | Status: DC
  Filled 2012-09-21: qty 750

## 2012-09-21 MED ORDER — ACETAMINOPHEN 325 MG PO TABS
650.0000 mg | ORAL_TABLET | Freq: Four times a day (QID) | ORAL | Status: DC | PRN
  Administered 2012-09-21 – 2012-09-24 (×6): 650 mg via ORAL
  Filled 2012-09-21 (×6): qty 2

## 2012-09-21 MED ORDER — VANCOMYCIN HCL IN DEXTROSE 1-5 GM/200ML-% IV SOLN
1000.0000 mg | Freq: Once | INTRAVENOUS | Status: AC
  Administered 2012-09-21: 1000 mg via INTRAVENOUS
  Filled 2012-09-21: qty 200

## 2012-09-21 MED ORDER — METHYLPREDNISOLONE SODIUM SUCC 125 MG IJ SOLR
125.0000 mg | Freq: Once | INTRAMUSCULAR | Status: AC
  Administered 2012-09-21: 125 mg via INTRAVENOUS
  Filled 2012-09-21: qty 2

## 2012-09-21 MED ORDER — IOHEXOL 350 MG/ML SOLN
100.0000 mL | Freq: Once | INTRAVENOUS | Status: AC | PRN
  Administered 2012-09-21: 100 mL via INTRAVENOUS

## 2012-09-21 MED ORDER — LEVALBUTEROL HCL 0.63 MG/3ML IN NEBU
0.6300 mg | INHALATION_SOLUTION | RESPIRATORY_TRACT | Status: DC | PRN
  Administered 2012-09-22 – 2012-09-24 (×2): 0.63 mg via RESPIRATORY_TRACT
  Filled 2012-09-21 (×2): qty 3

## 2012-09-21 MED ORDER — MAGNESIUM SULFATE 40 MG/ML IJ SOLN
2.0000 g | Freq: Once | INTRAMUSCULAR | Status: AC
  Administered 2012-09-21: 2 g via INTRAVENOUS
  Filled 2012-09-21: qty 50

## 2012-09-21 MED ORDER — SODIUM CHLORIDE 0.9 % IV BOLUS (SEPSIS)
500.0000 mL | Freq: Once | INTRAVENOUS | Status: AC
  Administered 2012-09-21: 500 mL via INTRAVENOUS

## 2012-09-21 MED ORDER — OXYCODONE HCL 5 MG PO TABS: 5.0000 mg | ORAL_TABLET | ORAL | Status: DC | PRN

## 2012-09-21 MED ORDER — LEVOFLOXACIN IN D5W 750 MG/150ML IV SOLN
750.0000 mg | Freq: Once | INTRAVENOUS | Status: AC
  Administered 2012-09-21: 750 mg via INTRAVENOUS
  Filled 2012-09-21: qty 150

## 2012-09-21 MED ORDER — ACETAMINOPHEN 500 MG PO TABS
1000.0000 mg | ORAL_TABLET | Freq: Once | ORAL | Status: AC
  Administered 2012-09-21: 1000 mg via ORAL
  Filled 2012-09-21: qty 2

## 2012-09-21 MED ORDER — VANCOMYCIN HCL IN DEXTROSE 1-5 GM/200ML-% IV SOLN: 1000.0000 mg | INTRAVENOUS | Status: DC

## 2012-09-21 MED ORDER — ENOXAPARIN SODIUM 40 MG/0.4ML ~~LOC~~ SOLN
40.0000 mg | SUBCUTANEOUS | Status: DC
  Administered 2012-09-21 – 2012-09-23 (×3): 40 mg via SUBCUTANEOUS
  Filled 2012-09-21 (×3): qty 0.4

## 2012-09-21 MED ORDER — LEVALBUTEROL HCL 0.63 MG/3ML IN NEBU
0.6300 mg | INHALATION_SOLUTION | Freq: Four times a day (QID) | RESPIRATORY_TRACT | Status: DC
  Administered 2012-09-21 – 2012-09-23 (×8): 0.63 mg via RESPIRATORY_TRACT
  Filled 2012-09-21 (×10): qty 3

## 2012-09-21 NOTE — ED Notes (Signed)
Pt reports feeling nauseated yesterday.  This morning woke up very SOB and has fever.  EMS administered 2 albuterol and 1 atrovent treatment.  Pt c/o pain in chest, upper abd, and both shoulders.  Rates pain at 9 and says it is much worse with movement.

## 2012-09-21 NOTE — ED Notes (Signed)
Patient does not need anything at this time. 

## 2012-09-21 NOTE — ED Provider Notes (Signed)
History     CSN: [id number]  Arrival date & time 09/21/12  4098   First MD Initiated Contact with Patient 09/21/12 [phone]      Chief Complaint  Patient presents with  . Shortness of Breath    (Consider location/radiation/quality/duration/timing/severity/associated sxs/prior treatment) HPI Comments: Patient is a 72 year old female who presents to the emergency department by EMS with complaint of shortness of breath. The patient states that on yesterday March 11 she was feeling at about her baseline. This morning at approximately 1 AM she had increasing problem shortness of breath, some chills. The patient also states that she started having some pain in her chest, upper abdomen, back, and both shoulders. The patient rated the pain 9 oral 1-10 scale. The pain seems to be worse with certain movement. The patient attempted elevating her bed and using and her home oxygen. the EMS personnel report the patient receiving 2 albuterol and one Atrovent nebulizer treatments while in route to the hospital however the patient continues to have difficulty with breathing and sensation of shortness of breath. It is of note that the patient has a history of chronic obstructive pulmonary disease on home oxygen. She has a history of chest pain, chronic bronchitis, and osteoporosis. She had an ejection fraction of approximately 60% tested one year ago.  The history is provided by the patient and the EMS personnel.    Past Medical History  Diagnosis Date  . Chest pain   . Abnormal EKG   . COPD (chronic obstructive pulmonary disease)   . Chronic bronchitis   . Osteoporosis 07/23/2011  . Osteoporosis 04/29/2012    Past Surgical History  Procedure Laterality Date  . Lung surgery      cyst in lower left lobe    Family History  Problem Relation Age of Onset  . Colon cancer      grandma  . Hypertension Mother     History  Substance Use Topics  . Smoking status: Former Games developer  . Smokeless tobacco:  Never Used  . Alcohol Use: No    OB History   Grav Para Term Preterm Abortions TAB SAB Ect Mult Living                  Review of Systems  Constitutional: Positive for fever and fatigue. Negative for diaphoresis and activity change.       All ROS Neg except as noted in HPI  HENT: Negative for nosebleeds and neck pain.   Eyes: Negative for photophobia and discharge.  Respiratory: Positive for shortness of breath and wheezing.   Cardiovascular: Positive for chest pain. Negative for palpitations and leg swelling.  Gastrointestinal: Negative for abdominal pain and blood in stool.  Genitourinary: Negative for dysuria, frequency and hematuria.  Musculoskeletal: Positive for arthralgias. Negative for back pain.  Skin: Negative.  Negative for color change.  Neurological: Negative for dizziness, seizures and speech difficulty.  Psychiatric/Behavioral: Negative for hallucinations and confusion.    Allergies  Hydrocodone; YNW:GNFAOZHY; Penicillins; Triptans; and Zocor  Home Medications   Current Outpatient Rx  Name  Route  Sig  Dispense  Refill  . acetaminophen (TYLENOL) 500 MG tablet   Oral   Take 500 mg by mouth every 6 (six) hours as needed for fever.         Marland Kitchen albuterol (PROVENTIL) 90 MCG/ACT inhaler   Inhalation   Inhale 2 puffs into the lungs every 4 (four) hours as needed for wheezing or shortness of breath.          Marland Kitchen  ALPRAZolam (XANAX) 0.5 MG tablet   Oral   Take 0.5 mg by mouth at bedtime as needed for sleep.          Marland Kitchen atorvastatin (LIPITOR) 10 MG tablet   Oral   Take 10 mg by mouth daily.           . budesonide-formoterol (SYMBICORT) 160-4.5 MCG/ACT inhaler   Inhalation   Inhale 2 puffs into the lungs 2 (two) times daily.           . Ibandronate Sodium (BONIVA IV)   Intravenous   Inject into the vein every 3 (three) months.           Marland Kitchen ipratropium-albuterol (DUONEB) 0.5-2.5 (3) MG/3ML SOLN   Nebulization   Take 3 mLs by nebulization every 4  (four) hours as needed (shortness of breath).         . Omeprazole Magnesium (PRILOSEC OTC PO)      1 tab po qd         . tiotropium (SPIRIVA) 18 MCG inhalation capsule   Inhalation   Place 18 mcg into inhaler and inhale daily.           Marland Kitchen Acetaminophen-Pamabrom (WOMENS TYLENOL PO)   Oral   Take by mouth as needed.             BP 150/96  Pulse 133  Temp(Src) 101.9 F (38.8 C) (Rectal)  Resp 28  Ht 5\' 2"  (1.575 m)  Wt 138 lb (62.596 kg)  BMI 25.23 kg/m2  SpO2 98%  Physical Exam  Nursing note and vitals reviewed. Constitutional: She is oriented to person, place, and time. She appears well-developed and well-nourished.  Non-toxic appearance.  HENT:  Head: Normocephalic.  Right Ear: Tympanic membrane and external ear normal.  Left Ear: Tympanic membrane and external ear normal.  Eyes: EOM and lids are normal. Pupils are equal, round, and reactive to light.  Neck: Normal range of motion. Neck supple. Carotid bruit is not present.  Cardiovascular: Regular rhythm, S1 normal, S2 normal, normal heart sounds, intact distal pulses and normal pulses.  Tachycardia present.   Pulmonary/Chest: Breath sounds normal. Accessory muscle usage present. Tachypnea noted. No respiratory distress.  Rhonchi and end expiratory tight wheezes present.  Abdominal: Soft. Bowel sounds are normal. There is no tenderness. There is no guarding.  Musculoskeletal: Normal range of motion.  Lymphadenopathy:       Head (right side): No submandibular adenopathy present.       Head (left side): No submandibular adenopathy present.    She has no cervical adenopathy.  Neurological: She is alert and oriented to person, place, and time. She has normal strength. No cranial nerve deficit or sensory deficit.  Skin: Skin is warm and dry. No cyanosis.  Psychiatric: She has a normal mood and affect. Her speech is normal.    ED Course  CRITICAL CARE Performed by: Kathie Dike Authorized by: Kathie Dike Total critical care time: 40 minutes Critical care was necessary to treat or prevent imminent or life-threatening deterioration of the following conditions: acute exacerbation of COPD, complicated by pneumonia with hypoxia. Critical care was time spent personally by me on the following activities: development of treatment plan with patient or surrogate, evaluation of patient's response to treatment, examination of patient, obtaining history from patient or surrogate, ordering and performing treatments and interventions, ordering and review of laboratory studies, ordering and review of radiographic studies, pulse oximetry, re-evaluation of patient's condition and review of old  charts.   (including critical care time)  Labs Reviewed  CULTURE, BLOOD (ROUTINE X 2)  CULTURE, BLOOD (ROUTINE X 2)  CBC  BASIC METABOLIC PANEL  PRO B NATRIURETIC PEPTIDE  TROPONIN I  LACTIC ACID, PLASMA  URINALYSIS, ROUTINE W REFLEX MICROSCOPIC  BLOOD GAS, ARTERIAL   No results found.  Date: 09/21/2012  Rate:129  Rhythm: sinus tachycardia  QRS Axis: left  Intervals: normal  ST/T Wave abnormalities: nonspecific ST changes  Conduction Disutrbances:noneNone  Narrative Interpretation:   Old EKG Reviewed: unchangedNo change from 11/2010.   No diagnosis found.    MDM  I have reviewed nursing notes, vital signs, and all appropriate lab and imaging results for this patient. 1026 recheck  - patient continues to speak in short sentences. She continues uses sensory muscles for breathing. Color remains good. Patient remains tachypneic and tachycardic. Pt seen with me by Dr Bebe Shaggy.  The electrocardiogram shows a sinus tachycardia of 129 per minute. There is some left axis deviation, but no apparent life-threatening arrhythmias. No STEMI. Rectal temperature is 101.9. Tylenol 1 g given by mouth. Patient will be started on a continuous albuterol nebulizer treatment. Chest xray reveals severe emphysema, but not acute  disease. Arterial blood gas on 2 L of oxygen by nasal cannula reveals a pH of 7.46 which is high. PCO2 is low at 34.4. The PO2 is low at 65, and the bicarbonate is elevated at 24.4. CBC reveals a WBC of 19.3, o/w normal. 1046 - No significant changes. IV magnesium added.  1116 - Chest pain could not be reproduced, CT angio ordered by Dr Bebe Shaggy to r/o occult pneumonia or PE, or mass. Pt is nearly finished with continuous neb. States she feels a little better with the nebulizer treatment and magnesium. The troponin is negative for acute event less than 0.30. The natruretic peptide (BNP) is 101.7. Urinalysis reveals many bacteria with positive nitrates however microscopic shows only 0-2 white cells and 3-6 red cells.  CT AngioJet is negative for pulmonary embolism but it does show a right lower lobe airspace consolidation consistent with pneumonia.  The patient will be started on antibiotics and the case will be discussed with the hospitalist for hospitalization.     Kathie Dike, PA-C 09/21/12 1307

## 2012-09-21 NOTE — ED Provider Notes (Signed)
Pt seen for SOB and chest pain She reports she has never experienced this CP before I can not reproduce the pain on my exam Concern for PE Ct angio ordered and will admit   Joya Gaskins, MD 09/21/12 1111

## 2012-09-21 NOTE — H&P (Signed)
[facility] History and Physical  MARVELLE SPAN UUV:[id number] DOB: 04/19/1941 DOA: 09/21/2012  Referring physician: Maurine Simmering, ER physician PCP: Lilyan Punt, MD  Specialists: None  Chief Complaint: Shortness of breath  HPI: Anita Hanson is a 72 y.o. female  With past medical history of COPD with chronic bronchitis, GERD and hyperlipidemia who has noted an overall increase in dyspnea of exertion in the past 6 months. In the past few days, she's noted increased coughing and significant shortness of breath, even at rest. She felt very well this morning and came into the emergency room. At that time she was noted to have a white count of 19.6 and a sinus tachycardic. ABG confirmed hypoxemia. Patient was started on IV antibiotics and additional supplemental oxygen. She initially required BiPAP, but was able to be weaned down to 2 L nasal cannula. Chest x-ray showed chronic emphysema severe, but the concern given sinus tachycardia was for possible pulmonary embolus. CT angiogram of the chest was negative for PE but did note pneumonia. Patient's lab work was otherwise unremarkable. Hospitalists were called for further evaluation and admission.  Review of Systems: Patient seen after being transferred to the floor. She complains of mild headache. She denies any vision changes, dysphasia, chest pain, palpitations. It was a chronic shortness of breath, more so in the past few weeks. She went to chronic cough which is occasionally productive with clear to yellow sputum. She's not able to have any kind of productive cough in the past 2 days because of dry mouth. Denies any wheezing. Denies abdominal pain, hematuria, dysuria, constipation, diarrhea, focal extremity numbness or weakness or pain. Her other complaints are for fatigue and thirst. Review systems otherwise negative.  Past Medical History  Diagnosis Date  . Chest pain   . Abnormal EKG   . COPD (chronic obstructive pulmonary disease)    . Chronic bronchitis   . Osteoporosis 07/23/2011  . Osteoporosis 04/29/2012   Past Surgical History  Procedure Laterality Date  . Lung surgery      cyst in lower left lobe   Social History:  reports that she has quit smoking. She has never used smokeless tobacco. She reports that she does not drink alcohol or use illicit drugs. Patient lives at home with her husband. She is normally able to participate in some activities of daily living using a walker. She says as of late her breathing in the last 6 months has declined were she's needing a walker more often.  Allergies  Allergen Reactions  . Hydrocodone     headaches  . KVQ:[id number] Nausea And Vomiting  . Penicillins   . Triptans     Chest pains  . Zocor (Simvastatin)     Arm pain    Family History  Problem Relation Age of Onset  . Colon cancer      grandma  . Hypertension Mother     Prior to Admission medications   Medication Sig Start Date End Date Taking? Authorizing Jasmain Ahlberg  acetaminophen (TYLENOL) 500 MG tablet Take 500 mg by mouth every 6 (six) hours as needed for fever.   Yes Historical Julia Kulzer, MD  albuterol (PROVENTIL) 90 MCG/ACT inhaler Inhale 2 puffs into the lungs every 4 (four) hours as needed for wheezing or shortness of breath.    Yes Historical Johnita Palleschi, MD  ALPRAZolam Prudy Feeler) 0.5 MG tablet Take 0.5 mg by mouth at bedtime as needed for sleep.    Yes Historical Jessilynn Taft, MD  atorvastatin (LIPITOR) 10 MG tablet Take 10  mg by mouth daily.     Yes Historical Twila Rappa, MD  budesonide-formoterol (SYMBICORT) 160-4.5 MCG/ACT inhaler Inhale 2 puffs into the lungs 2 (two) times daily.     Yes Historical Laniece Hornbaker, MD  Ibandronate Sodium (BONIVA IV) Inject into the vein every 3 (three) months.     Yes Historical Zayna Toste, MD  ipratropium-albuterol (DUONEB) 0.5-2.5 (3) MG/3ML SOLN Take 3 mLs by nebulization every 4 (four) hours as needed (shortness of breath).   Yes Historical Avalon Coppinger, MD  Omeprazole Magnesium (PRILOSEC  OTC PO) 1 tab po qd   Yes Historical Guss Farruggia, MD  tiotropium (SPIRIVA) 18 MCG inhalation capsule Place 18 mcg into inhaler and inhale daily.     Yes Historical Marionette Meskill, MD  Acetaminophen-Pamabrom (WOMENS TYLENOL PO) Take by mouth as needed.      Historical Robt Okuda, MD   Physical Exam: Filed Vitals:   09/21/12 1211 09/21/12 1220 09/21/12 1342 09/21/12 1447  BP: 123/42  111/58 122/68  Pulse: 125  121 92  Temp:    97.9 F (36.6 C)  TempSrc:    Oral  Resp: 20  22 20   Height:    5\' 2"  (1.575 m)  Weight:    63 kg (138 lb 14.2 oz)  SpO2: 86% 93%  92%     General:  Alert and oriented x3, mild distress secondary to fatigue and heavy breathing  Eyes: Sclera nonicteric, extraocular movements are intact  ENT: Normocephalic, atraumatic, mucous membranes are slightly dry  Neck: Supple, no evidence of JVD  Cardiovascular: Regular rhythm, mild tachycardia  Respiratory: Decreased breath sounds throughout with few rales. No wheezing.  Abdomen: Soft, nontender, nondistended with positive bowel sounds-hypoactive  Skin: No overt skin breaks, tears or lesions  Musculoskeletal: No clubbing or cyanosis or edema  Psychiatric: Patient is appropriate, no evidence of psychoses  Neurologic: No focal deficits  Labs on Admission:  Basic Metabolic Panel:  Recent Labs Lab 09/21/12 1005  NA 135  K 3.7  CL 97  CO2 27  GLUCOSE 114*  BUN 14  CREATININE 0.70  CALCIUM 9.7   CBC:  Recent Labs Lab 09/21/12 1005  WBC 19.3*  HGB 14.1  HCT 42.2  MCV 91.3  PLT 273   Cardiac Enzymes:  Recent Labs Lab 09/21/12 1005  TROPONINI <0.30    BNP (last 3 results)  Recent Labs  09/21/12 1005  PROBNP 101.7    Radiological Exams on Admission: Ct Angio Chest Pe W/cm &/or Wo Cm  09/21/2012    IMPRESSION:  1.  Negative for pulmonary embolus. 2.  Right lower lobe air space consolidation is likely due to pneumonia.  Follow-up to clearing is recommended. 3.  Tiny right upper lobe nodule is  not well seen on the prior examination. Given risk factors for bronchogenic carcinoma, follow- up chest CT at 1 year is recommended.  This recommendation follows the consensus statement:  Guidelines for Management of Small Pulmonary Nodules Detected on CT Scans:  A Statement from the Fleischner Society as published in Radiology 2005; 237:395-400.   Original Report Authenticated By: Leanna Battles, M.D.    Dg Chest Port 1 View  09/21/2012   IMPRESSION: Severe emphysema without acute disease.   Original Report Authenticated By: Holley Dexter, M.D.     EKG: Independently reviewed. Sinus tachycardia  Assessment/Plan Principal Problem:   SIRS (systemic inflammatory response syndrome): Given elevated heart rate and hypoxia, consistent with SIRS. See below. Blood pressure stable and symptoms improving with oxygen and antibiotics Active Problems:   Osteoporosis: On  IV Boniva Q3 months.    COPD exacerbation: Lung fields mostly clear. Looks to be chronic bronchitis with acute exacerbation. Will use antibiotics plus oxygen support plus scheduled and when necessary nebulizers. No steroids at this time.    CAP (community acquired pneumonia): Patient is allergic to cephalosporins, so we'll use IV Levaquin    GERD (gastroesophageal reflux disease): Continue PPI.    Other and unspecified hyperlipidemia: Continue statin.     Code Status: DO NOT RESUSCITATE as confirmed by patient and her family.  Family Communication: Plan discussed with patient, her husband and other members of family at the bedside.  Disposition Plan: Home in a few days  Time spent: 30 minutes  Hollice Espy [facility] Pager [phone]  If 7PM-7AM, please contact night-coverage [email or web address] Password [email or web address] 09/21/2012, 3:26 PM

## 2012-09-21 NOTE — Progress Notes (Signed)
ANTIBIOTIC CONSULT NOTE - INITIAL  Pharmacy Consult for Vancomycin Indication: pneumonia  Allergies  Allergen Reactions  . Hydrocodone     headaches  . RUE:[id number] Nausea And Vomiting  . Penicillins   . Triptans     Chest pains  . Zocor (Simvastatin)     Arm pain   Patient Measurements: Height: 5\' 2"  (157.5 cm) Weight: 138 lb (62.596 kg) IBW/kg (Calculated) : 50.1  Vital Signs: Temp: 101.9 F (38.8 C) (03/12 1010) Temp src: Rectal (03/12 1010) BP: 111/58 mmHg (03/12 1342) Pulse Rate: 121 (03/12 1342) Intake/Output from previous day:   Intake/Output from this shift:    Labs:  Recent Labs  09/21/12 1005  WBC 19.3*  HGB 14.1  PLT 273  CREATININE 0.70   Estimated Creatinine Clearance: 54.5 ml/min (by C-G formula based on Cr of 0.7). No results found for this basename: VANCOTROUGH, VANCOPEAK, VANCORANDOM, GENTTROUGH, GENTPEAK, GENTRANDOM, TOBRATROUGH, TOBRAPEAK, TOBRARND, AMIKACINPEAK, AMIKACINTROU, AMIKACIN,  in the last 72 hours   Microbiology: No results found for this or any previous visit (from the past 720 hour(s)).  Medical History: Past Medical History  Diagnosis Date  . Chest pain   . Abnormal EKG   . COPD (chronic obstructive pulmonary disease)   . Chronic bronchitis   . Osteoporosis 07/23/2011  . Osteoporosis 04/29/2012   Medications:  Scheduled:  . [COMPLETED] acetaminophen  1,000 mg Oral Once  . [COMPLETED] albuterol  10 mg/hr Nebulization Once  . levofloxacin (LEVAQUIN) IV  750 mg Intravenous Once  . [COMPLETED] magnesium sulfate  2 g Intravenous Once  . [COMPLETED] methylPREDNISolone (SOLU-MEDROL) injection  125 mg Intravenous Once  . [COMPLETED] sodium chloride  500 mL Intravenous Once  . [START ON 09/22/2012] vancomycin  750 mg Intravenous Q12H  . vancomycin  1,000 mg Intravenous Once  . [DISCONTINUED] vancomycin  1,000 mg Intravenous STAT   Assessment: 72yo female admitted for SOB and chills.  Pt has good renal fxn.  Estimated  Creatinine Clearance: 54.5 ml/min (by C-G formula based on Cr of 0.7).    Goal of Therapy:  Vancomycin trough level 15-20 mcg/ml  Plan: Vancomycin 1gm IV x 1 dose then 750mg  IV q12hrs Check trough at steady state Monitor labs, renal fxn, and cultures per protocol Duration of therapy per MD (anticipate 8 days)  Valrie Hart A 09/21/2012,2:10 PM

## 2012-09-21 NOTE — ED Provider Notes (Signed)
Medical screening examination/treatment/procedure(s) were conducted as a shared visit with non-physician practitioner(s) and myself.  I personally evaluated the patient during the encounter  I checked on patient frequently.  She started to improve.  Pneumonia noted.  No recent hospitalizations.  D/w dr Francene Castle Rito Ehrlich, will admit to tele.    CRITICAL CARE Performed by: Joya Gaskins   Total critical care time: 40  Critical care time was exclusive of separately billable procedures and treating other patients.  Critical care was necessary to treat or prevent imminent or life-threatening deterioration.  Critical care was time spent personally by me on the following activities: development of treatment plan with patient and/or surrogate as well as nursing, discussions with consultants, evaluation of patient's response to treatment, examination of patient, obtaining history from patient or surrogate, ordering and performing treatments and interventions, ordering and review of laboratory studies, ordering and review of radiographic studies, pulse oximetry and re-evaluation of patient's condition.   Joya Gaskins, MD 09/21/12 1534

## 2012-09-22 DIAGNOSIS — J9620 Acute and chronic respiratory failure, unspecified whether with hypoxia or hypercapnia: Secondary | ICD-10-CM | POA: Diagnosis present

## 2012-09-22 DIAGNOSIS — J189 Pneumonia, unspecified organism: Secondary | ICD-10-CM

## 2012-09-22 DIAGNOSIS — J441 Chronic obstructive pulmonary disease with (acute) exacerbation: Secondary | ICD-10-CM

## 2012-09-22 LAB — CBC
HCT: 38.3 % (ref 36.0–46.0)
MCH: 29.6 pg (ref 26.0–34.0)
MCHC: 32.6 g/dL (ref 30.0–36.0)
MCV: 90.8 fL (ref 78.0–100.0)
Platelets: 306 10*3/uL (ref 150–400)
RDW: 12.2 % (ref 11.5–15.5)
WBC: 24.2 10*3/uL — ABNORMAL HIGH (ref 4.0–10.5)

## 2012-09-22 LAB — BASIC METABOLIC PANEL
BUN: 12 mg/dL (ref 6–23)
Calcium: 9.3 mg/dL (ref 8.4–10.5)
Chloride: 101 mEq/L (ref 96–112)
Creatinine, Ser: 0.58 mg/dL (ref 0.50–1.10)
GFR calc Af Amer: >90 mL/min (ref >90)
GFR calc non Af Amer: 89 mL/min — ABNORMAL LOW (ref >90)

## 2012-09-22 LAB — LEGIONELLA ANTIGEN, URINE: Legionella Antigen, Urine: NEGATIVE

## 2012-09-22 MED ORDER — BUDESONIDE-FORMOTEROL FUMARATE 160-4.5 MCG/ACT IN AERO
2.0000 | INHALATION_SPRAY | Freq: Two times a day (BID) | RESPIRATORY_TRACT | Status: DC
  Administered 2012-09-22 – 2012-09-24 (×5): 2 via RESPIRATORY_TRACT
  Filled 2012-09-22: qty 6

## 2012-09-22 NOTE — Care Management Note (Signed)
    Page 1 of 1   09/23/2012     2:13:56 PM   CARE MANAGEMENT NOTE 09/23/2012  Patient:  Anita Hanson, Anita Hanson   Account Number:  [id number]  Date Initiated:  09/22/2012  Documentation initiated by:  Rosemary Holms  Subjective/Objective Assessment:   Pt admitted from home where she lives wth her spouse. On chronic O2 at home. Pt states she can often tell if she is getting worse and calls her PCP [place]. This onset scared her. Pt and spouse very receptive to [facility] RN coming to visit them     Action/Plan:   and following her progress. Also agree to [facility] RN if needed at DC. No DME needed at DC   Anticipated DC Date:  09/24/2012   Anticipated DC Plan:  HOME W HOME HEALTH SERVICES      DC Planning Services  CM consult      Choice offered to / List presented to:          [facility] arranged  HH-1 RN  HH-10 DISEASE MANAGEMENT      Status of service:  Completed, signed off Medicare Important Message given?   (If response is "NO", the following Medicare IM given date fields will be blank) Date Medicare IM given:   Date Additional Medicare IM given:    Discharge Disposition:    Per UR Regulation:    If discussed at Long Length of Stay Meetings, dates discussed:    Comments:  09/23/12 Rosemary Holms RN BSN CM Pt and spouse pleased to be accepted into [facility] program. Also will have [facility] RN at DC. No equipment needed.  09/22/12 Rosemary Holms RN BSN CM

## 2012-09-22 NOTE — Progress Notes (Signed)
Thank you to Rosemary Holms RN CM for this referral.  Patient evaluated for community based chronic disease management services with [facility] Care Management Program as a benefit of patient's [employer]. Patient will receive a post discharge transition of care call and will be evaluated for monthly home visits for assessments and disease process education. Spoke with patient's husband Calise Dunckel via phone to explain [facility] Care Management services.  Husband indicated that patient was receiving a breathing treatment and was not available to speak.  Husband indicated that they are able to procure medications without difficulty, they have reliable transportation to get the patient to her appointment, and they are able to get to same day or next day MD appointments for acute care follow ups.  However, spouse indicated that he and his wife are not knowledgeable of what COPD symptoms to look for or how to address them in an effort to prevent hospitalizations or acute illness.  Informed spouse that [facility] would provide a Building surveyor to provide COPD education and management post discharge.  Further informed him that writer would follow up with his wife at bedside to obtain written consents.  Spouse verbalized understanding.  Made Rosemary Holms [facility] inpatient Case Manager aware that [facility] Care Management following.  Will continue to monitor patients disposition. Of note, [facility] Care Management services does not replace or interfere with any services that are arranged by inpatient case management or social work.  For additional questions or referrals please contact Anibal Henderson BSN RN [facility] Liaison at [phone].

## 2012-09-22 NOTE — Progress Notes (Signed)
Chart reviewed.  Subjective: Feels a little better. Cough is nonproductive. Has not yet been out of bed. Appetite improved. Wears oxygen chronically at home. Asking about her Symbicort.  Objective: Vital signs in last 24 hours: Filed Vitals:   09/21/12 1447 09/21/12 2034 09/21/12 2135 09/22/12 0538  BP: 122/68  108/54 103/61  Pulse: 92  81 81  Temp: 97.9 F (36.6 C)  97.4 F (36.3 C) 97.3 F (36.3 C)  TempSrc: Oral  Oral Oral  Resp: 20  17 16   Height: 5\' 2"  (1.575 m)     Weight: 63 kg (138 lb 14.2 oz)     SpO2: 92% 89% 95% 95%   Weight change:   Intake/Output Summary (Last 24 hours) at 09/22/12 1112 Last data filed at 09/22/12 0450  Gross per 24 hour  Intake      0 ml  Output   1150 ml  Net  -1150 ml   General: Comfortable at rest. Appears short of breath with talking. Lungs: Diminished throughout with diffuse bronchial breath sounds. No wheezes rhonchi or rales. Cardiovascular regular rate rhythm without murmurs gallops rubs Abdomen soft nontender nondistended Extremities no clubbing cyanosis or edema  Lab Results: Basic Metabolic Panel:  Recent Labs Lab 09/21/12 1005 09/22/12 0545  NA 135 135  K 3.7 3.9  CL 97 101  CO2 27 24  GLUCOSE 114* 133*  BUN 14 12  CREATININE 0.70 0.58  CALCIUM 9.7 9.3   Liver Function Tests: No results found for this basename: AST, ALT, ALKPHOS, BILITOT, PROT, ALBUMIN,  in the last 168 hours No results found for this basename: LIPASE, AMYLASE,  in the last 168 hours No results found for this basename: AMMONIA,  in the last 168 hours CBC:  Recent Labs Lab 09/21/12 1855 09/22/12 0545  WBC 25.4* 24.2*  HGB 13.0 12.5  HCT 38.8 38.3  MCV 91.5 90.8  PLT 280 306   Cardiac Enzymes:  Recent Labs Lab 09/21/12 1005  TROPONINI <0.30   BNP:  Recent Labs Lab 09/21/12 1005  PROBNP 101.7   D-Dimer: No results found for this basename: DDIMER,  in the last 168 hours CBG: No results found for this basename: GLUCAP,  in the  last 168 hours Hemoglobin A1C: No results found for this basename: HGBA1C,  in the last 168 hours Fasting Lipid Panel: No results found for this basename: CHOL, HDL, LDLCALC, TRIG, CHOLHDL, LDLDIRECT,  in the last 168 hours Thyroid Function Tests: No results found for this basename: TSH, T4TOTAL, FREET4, T3FREE, THYROIDAB,  in the last 168 hours Coagulation: No results found for this basename: LABPROT, INR,  in the last 168 hours Anemia Panel: No results found for this basename: VITAMINB12, FOLATE, FERRITIN, TIBC, IRON, RETICCTPCT,  in the last 168 hours Urine Drug Screen: Drugs of Abuse  No results found for this basename: labopia, cocainscrnur, labbenz, amphetmu, thcu, labbarb    Alcohol Level: No results found for this basename: ETH,  in the last 168 hours Urinalysis:  Recent Labs Lab 09/21/12 1134  COLORURINE YELLOW  LABSPEC 1.015  PHURINE 7.0  GLUCOSEU NEGATIVE  HGBUR TRACE*  BILIRUBINUR NEGATIVE  KETONESUR NEGATIVE  PROTEINUR TRACE*  UROBILINOGEN 0.2  NITRITE POSITIVE*  LEUKOCYTESUR NEGATIVE   Micro Results: Recent Results (from the past 240 hour(s))  CULTURE, BLOOD (ROUTINE X 2)     Status: None   Collection Time    09/21/12 10:05 AM      Result Value Range Status   Specimen Description BLOOD RIGHT ARM  Final   Special Requests BOTTLES DRAWN AEROBIC AND ANAEROBIC 6CC   Final   Culture NO GROWTH 1 DAY   Final   Report Status PENDING   Incomplete  CULTURE, BLOOD (ROUTINE X 2)     Status: None   Collection Time    09/21/12 10:12 AM      Result Value Range Status   Specimen Description BLOOD LEFT ARM   Final   Special Requests BOTTLES DRAWN AEROBIC AND ANAEROBIC 6CC   Final   Culture NO GROWTH 1 DAY   Final   Report Status PENDING   Incomplete   Studies/Results: Ct Angio Chest Pe W/cm &/or Wo Cm  09/21/2012  *RADIOLOGY REPORT*  Clinical Data: Shortness of breath.  Chest and upper abdominal pain.  Nausea and fever.  CT ANGIOGRAPHY CHEST  Technique:   Multidetector CT imaging of the chest using the standard protocol during bolus administration of intravenous contrast. Multiplanar reconstructed images including MIPs were obtained and reviewed to evaluate the vascular anatomy.  Contrast: OMNIPAQUE IOHEXOL 350 MG/ML SOLN  Comparison: Chest radiograph 09/21/2012 and chest CT 11/07/2010.  Findings: No pulmonary embolus.  Mediastinal and hilar lymph nodes are not enlarged by CT size criteria.  No axillary adenopathy. Atherosclerotic calcification of the arterial vasculature, including coronary arteries.  Heart size normal.  No pericardial effusion.  Severe emphysema.  There are areas of airspace consolidation in the right middle lobe and right lower lobe.  Findings are worst in the medial right lower lobe.  4 mm subpleural nodular density in the right lower lobe (image 60) appears unchanged.  A  3 mm subpleural nodular density in the inferior right upper lobe (image 50) is not well seen on the prior exam.  Scattered areas of mucoid impaction. No pleural fluid.  Airway is unremarkable.  Incidental imaging of the upper abdomen shows mild irregularity of the liver margin but the liver is incompletely imaged.  Small hiatal hernia.  No worrisome lytic or sclerotic lesions.  IMPRESSION:  1.  Negative for pulmonary embolus. 2.  Right lower lobe air space consolidation is likely due to pneumonia.  Follow-up to clearing is recommended. 3.  Tiny right upper lobe nodule is not well seen on the prior examination. Given risk factors for bronchogenic carcinoma, follow- up chest CT at 1 year is recommended.  This recommendation follows the consensus statement:  Guidelines for Management of Small Pulmonary Nodules Detected on CT Scans:  A Statement from the Fleischner Society as published in Radiology 2005; 237:395-400.   Original Report Authenticated By: Leanna Battles, M.D.    Dg Chest Port 1 View  09/21/2012  *RADIOLOGY REPORT*  Clinical Data: Shortness of breath.   PORTABLE CHEST - 1 VIEW  Comparison: CT chest 11/07/2010 and PA and lateral chest 10/28/2010.  Findings: The lungs are markedly emphysematous.  There is some bibasilar scarring which appears unchanged.  No pneumothorax or pleural fluid.  Heart size normal.  IMPRESSION: Severe emphysema without acute disease.   Original Report Authenticated By: Holley Dexter, M.D.    Scheduled Meds: . atorvastatin  10 mg Oral q1800  . budesonide-formoterol  2 puff Inhalation BID  . enoxaparin (LOVENOX) injection  40 mg Subcutaneous Q24H  . ipratropium  0.5 mg Nebulization Q6H  . levalbuterol  0.63 mg Nebulization Q6H  . levofloxacin (LEVAQUIN) IV  750 mg Intravenous Q24H  . pantoprazole  40 mg Oral Daily   Continuous Infusions: . sodium chloride 10 mL/hr at 09/21/12 1622   PRN Meds:.acetaminophen,  ALPRAZolam, levalbuterol, oxyCODONE Assessment/Plan: Principal Problem:   SIRS (systemic inflammatory response syndrome) Active Problems:   CAP (community acquired pneumonia)   Acute-on-chronic respiratory failure   COPD exacerbation   Osteoporosis   GERD (gastroesophageal reflux disease)   Other and unspecified hyperlipidemia  Will discontinue telemetry. Increase activity. Resume Symbicort. Continue bronchodilators. Continue antibiotics. Still quite dyspneic and not yet ready for discharge.   LOS: 1 day   Anita Hanson L 09/22/2012, 11:12 AM

## 2012-09-22 NOTE — Progress Notes (Signed)
Telemetry discontinued,Central Telemetry notified.

## 2012-09-23 DIAGNOSIS — B370 Candidal stomatitis: Secondary | ICD-10-CM | POA: Diagnosis present

## 2012-09-23 DIAGNOSIS — R6510 Systemic inflammatory response syndrome (SIRS) of non-infectious origin without acute organ dysfunction: Secondary | ICD-10-CM

## 2012-09-23 DIAGNOSIS — M5430 Sciatica, unspecified side: Secondary | ICD-10-CM | POA: Diagnosis present

## 2012-09-23 LAB — CBC
HCT: 40 % (ref 36.0–46.0)
MCHC: 32.8 g/dL (ref 30.0–36.0)
Platelets: 335 10*3/uL (ref 150–400)
RDW: 12.3 % (ref 11.5–15.5)
WBC: 14.7 10*3/uL — ABNORMAL HIGH (ref 4.0–10.5)

## 2012-09-23 MED ORDER — MAGIC MOUTHWASH
5.0000 mL | Freq: Two times a day (BID) | ORAL | Status: DC
  Administered 2012-09-23 – 2012-09-24 (×2): 5 mL via ORAL
  Filled 2012-09-23 (×2): qty 5

## 2012-09-23 MED ORDER — BIOTENE DRY MOUTH MT LIQD
15.0000 mL | Freq: Two times a day (BID) | OROMUCOSAL | Status: DC
  Administered 2012-09-23 – 2012-09-24 (×3): 15 mL via OROMUCOSAL

## 2012-09-23 MED ORDER — GUAIFENESIN-CODEINE 100-10 MG/5ML PO SOLN
10.0000 mL | Freq: Four times a day (QID) | ORAL | Status: DC | PRN
  Administered 2012-09-23: 10 mL via ORAL
  Filled 2012-09-23: qty 10

## 2012-09-23 NOTE — Progress Notes (Signed)
UR Chart Review Completed  

## 2012-09-23 NOTE — Progress Notes (Signed)
Spoke with patient and spouse at bedside.  Written consents obtained, address, contact information, and PCP verified. Will continue to monitor disposition.  Of note, [facility] Care Management services does not replace or interfere with any services that are arranged by inpatient case management or social work.  For additional questions or referrals please contact Anibal Henderson BSN RN [facility] Liaison at [phone].

## 2012-09-23 NOTE — Progress Notes (Signed)
Patient ambulated around nursing station without difficulty x's 1 assist ,patient also used walker.

## 2012-09-23 NOTE — Progress Notes (Deleted)
Subjective: Patient is somewhat better. Breathing definitely better than when she first came in, although not yet at baseline. Gets quite short of breath with any type of activity, such as going to the bathroom. Some cough, but she's not able to pull anything up. Just complains of some thrush in her mouth. When asked if she has any problems with choking, she says that at times she does get choked up with food-she's had her esophagus stretched before  Objective: Vital signs in last 24 hours: Filed Vitals:   09/23/12 0544 09/23/12 0842 09/23/12 1338 09/23/12 1447  BP: 117/73  131/73   Pulse: 78  100   Temp: 97.5 F (36.4 C)  97.9 F (36.6 C)   TempSrc: Oral  Oral   Resp: 16  18   Height:      Weight:      SpO2: 95% 77% 92% 89%   Weight change:   Intake/Output Summary (Last 24 hours) at 09/23/12 1815 Last data filed at 09/23/12 1200  Gross per 24 hour  Intake 1556.33 ml  Output    700 ml  Net 856.33 ml   General: Comfortable at rest. Appears short of breath with talking. Lungs: Decreased breath sounds throughout, mild end expiratory wheeze bilaterally Cardiovascular regular rate rhythm without murmurs gallops rubs Abdomen soft nontender nondistended Extremities no clubbing cyanosis or edema  Lab Results: Basic Metabolic Panel:  Recent Labs Lab 09/21/12 1005 09/22/12 0545  NA 135 135  K 3.7 3.9  CL 97 101  CO2 27 24  GLUCOSE 114* 133*  BUN 14 12  CREATININE 0.70 0.58  CALCIUM 9.7 9.3   CBC:  Recent Labs Lab 09/21/12 1855 09/22/12 0545  WBC 25.4* 24.2*  HGB 13.0 12.5  HCT 38.8 38.3  MCV 91.5 90.8  PLT 280 306   Cardiac Enzymes:  Recent Labs Lab 09/21/12 1005  TROPONINI <0.30   BNP:  Recent Labs Lab 09/21/12 1005  PROBNP 101.7   Urinalysis:  Recent Labs Lab 09/21/12 1134  COLORURINE YELLOW  LABSPEC 1.015  PHURINE 7.0  GLUCOSEU NEGATIVE  HGBUR TRACE*  BILIRUBINUR NEGATIVE  KETONESUR NEGATIVE  PROTEINUR TRACE*  UROBILINOGEN 0.2   NITRITE POSITIVE*  LEUKOCYTESUR NEGATIVE   Micro Results: Recent Results (from the past 240 hour(s))  CULTURE, BLOOD (ROUTINE X 2)     Status: None   Collection Time    09/21/12 10:05 AM      Result Value Range Status   Specimen Description BLOOD RIGHT ARM   Final   Special Requests BOTTLES DRAWN AEROBIC AND ANAEROBIC 6CC   Final   Culture NO GROWTH 2 DAYS   Final   Report Status PENDING   Incomplete  CULTURE, BLOOD (ROUTINE X 2)     Status: None   Collection Time    09/21/12 10:12 AM      Result Value Range Status   Specimen Description BLOOD LEFT ARM   Final   Special Requests BOTTLES DRAWN AEROBIC AND ANAEROBIC 6CC   Final   Culture NO GROWTH 2 DAYS   Final   Report Status PENDING   Incomplete  CULTURE, EXPECTORATED SPUTUM-ASSESSMENT     Status: None   Collection Time    09/23/12  1:35 AM      Result Value Range Status   Specimen Description SPUTUM   Final   Special Requests Normal   Final   Sputum evaluation     Final   Value: MICROSCOPIC FINDINGS SUGGEST THAT THIS SPECIMEN IS NOT REPRESENTATIVE  OF LOWER RESPIRATORY SECRETIONS. PLEASE RECOLLECT.   Report Status 09/23/2012 FINAL   Final   Studies/Results: No results found. Scheduled Meds: . antiseptic oral rinse  15 mL Mouth Rinse BID  . atorvastatin  10 mg Oral q1800  . budesonide-formoterol  2 puff Inhalation BID  . enoxaparin (LOVENOX) injection  40 mg Subcutaneous Q24H  . ipratropium  0.5 mg Nebulization Q6H  . levalbuterol  0.63 mg Nebulization Q6H  . levofloxacin (LEVAQUIN) IV  750 mg Intravenous Q24H  . magic mouthwash  5 mL Oral BID  . pantoprazole  40 mg Oral Daily   Continuous Infusions: . sodium chloride Stopped (09/22/12 1900)   PRN Meds:.acetaminophen, ALPRAZolam, guaiFENesin-codeine, levalbuterol, oxyCODONE Assessment/Plan: Principal Problem:   SIRS (systemic inflammatory response syndrome) Active Problems:   Osteoporosis   COPD exacerbation   CAP (community acquired pneumonia)   GERD  (gastroesophageal reflux disease)   Other and unspecified hyperlipidemia   Acute-on-chronic respiratory failure  Will discontinue telemetry. Increase activity. Resume Symbicort. Continue bronchodilators. Continue antibiotics. Still quite dyspneic and not yet ready for discharge.   LOS: 2 days   Virginia Rochester K 09/23/2012, 6:15 PM

## 2012-09-23 NOTE — Progress Notes (Addendum)
[facility] PROGRESS NOTE  Anita Hanson WUJ:[id number] DOB: Jun 16, 1941 DOA: 09/21/2012 PCP: Lilyan Punt, MD  Assessment/Plan: Principal Problem:   SIRS (systemic inflammatory response syndrome) : Stable. Better. Continue antibiotics. Blood cultures negative. Active Problems:   Osteoporosis   COPD exacerbation : Continue nebulizers plus oxygen plus antibiotics. Will try prednisone taper.  CAP (community acquired pneumonia) : noted by blood cell count still elevated as of yesterday. Recheck labs. Suspect some of her issues may be related to silent aspiration pneumonia. She does state that she choked on food and has had esophagus stretched in the past. Have speech therapy to evaluate. If white blood cell count still elevated today, we'll add Primaxin and vancomycin  GERD (gastroesophageal reflux disease)  Other and unspecified hyperlipidemia   Acute-on-chronic respiratory failure: Baseline is 2-3 L at home. On 5-6 here now.  Sciatica: Examination of patient, her symptoms are consistent with sciatica. Patient spends a lot of time sitting into compression of sciatic care. We'll provide patient information on stretching and exercise.  Thrush: Magic mouthwash   Code Status: Full code  Family Communication: Plan discussed with family who came to the bedside.  Disposition Plan: Home, possibly in a few days   Consultants:  None  Procedures:  Have ordered a swallow evaluation by speech therapy  Antibiotics:  IV Levaquin, day 3  HPI/Subjective: Patient is somewhat better. Breathing definitely better than when she first came in, although not yet at baseline. Gets quite short of breath with any type of activity, such as going to the bathroom. Some cough, but she's not able to pull anything up. Just complains of some thrush in her mouth. When asked if she has any problems with choking, she says that at times she does get choked up with food-she's had her esophagus stretched  before. Patient also complains of some chronic episodes of leg pain. When asked to clarify it starts on one side of her buttocks and goes all the way down the back side of her leg. She still sometimes it's in her to Allegra sometimes both. Complains of cramping and tingling.   Objective: Filed Vitals:   09/23/12 0544 09/23/12 0842 09/23/12 1338 09/23/12 1447  BP: 117/73  131/73   Pulse: 78  100   Temp: 97.5 F (36.4 C)  97.9 F (36.6 C)   TempSrc: Oral  Oral   Resp: 16  18   Height:      Weight:      SpO2: 95% 77% 92% 89%    Intake/Output Summary (Last 24 hours) at 09/23/12 1818 Last data filed at 09/23/12 1200  Gross per 24 hour  Intake 1556.33 ml  Output    700 ml  Net 856.33 ml   Filed Weights   09/21/12 1010 09/21/12 1447  Weight: 62.596 kg (138 lb) 63 kg (138 lb 14.2 oz)    Exam:  General: Comfortable at rest. Appears short of breath with talking.  Lungs: Decreased breath sounds throughout, mild end expiratory wheeze bilaterally  Cardiovascular regular rate rhythm without murmurs gallops rubs  Abdomen soft nontender nondistended  Extremities no clubbing cyanosis or edema   Data Reviewed: Basic Metabolic Panel:  Recent Labs Lab 09/21/12 1005 09/22/12 0545  NA 135 135  K 3.7 3.9  CL 97 101  CO2 27 24  GLUCOSE 114* 133*  BUN 14 12  CREATININE 0.70 0.58  CALCIUM 9.7 9.3   CBC:  Recent Labs Lab 09/21/12 1005 09/21/12 1855 09/22/12 0545  WBC 19.3* 25.4* 24.2*  HGB 14.1 13.0 12.5  HCT 42.2 38.8 38.3  MCV 91.3 91.5 90.8  PLT 273 280 306   Cardiac Enzymes:  Recent Labs Lab 09/21/12 1005  TROPONINI <0.30   BNP (last 3 results)  Recent Labs  09/21/12 1005  PROBNP 101.7   CBG: No results found for this basename: GLUCAP,  in the last 168 hours  Recent Results (from the past 240 hour(s))  CULTURE, BLOOD (ROUTINE X 2)     Status: None   Collection Time    09/21/12 10:05 AM      Result Value Range Status   Specimen Description BLOOD RIGHT  ARM   Final   Special Requests BOTTLES DRAWN AEROBIC AND ANAEROBIC 6CC   Final   Culture NO GROWTH 2 DAYS   Final   Report Status PENDING   Incomplete  CULTURE, BLOOD (ROUTINE X 2)     Status: None   Collection Time    09/21/12 10:12 AM      Result Value Range Status   Specimen Description BLOOD LEFT ARM   Final   Special Requests BOTTLES DRAWN AEROBIC AND ANAEROBIC 6CC   Final   Culture NO GROWTH 2 DAYS   Final   Report Status PENDING   Incomplete  CULTURE, EXPECTORATED SPUTUM-ASSESSMENT     Status: None   Collection Time    09/23/12  1:35 AM      Result Value Range Status   Specimen Description SPUTUM   Final   Special Requests Normal   Final   Sputum evaluation     Final   Value: MICROSCOPIC FINDINGS SUGGEST THAT THIS SPECIMEN IS NOT REPRESENTATIVE OF LOWER RESPIRATORY SECRETIONS. PLEASE RECOLLECT.   Report Status 09/23/2012 FINAL   Final     Studies: No results found.  Scheduled Meds: . antiseptic oral rinse  15 mL Mouth Rinse BID  . atorvastatin  10 mg Oral q1800  . budesonide-formoterol  2 puff Inhalation BID  . enoxaparin (LOVENOX) injection  40 mg Subcutaneous Q24H  . ipratropium  0.5 mg Nebulization Q6H  . levalbuterol  0.63 mg Nebulization Q6H  . levofloxacin (LEVAQUIN) IV  750 mg Intravenous Q24H  . magic mouthwash  5 mL Oral BID  . pantoprazole  40 mg Oral Daily   Continuous Infusions: . sodium chloride Stopped (09/22/12 1900)    Principal Problem:   SIRS (systemic inflammatory response syndrome) Active Problems:   Osteoporosis   COPD exacerbation   CAP (community acquired pneumonia)   GERD (gastroesophageal reflux disease)   Other and unspecified hyperlipidemia   Acute-on-chronic respiratory failure    Time spent: 30 minutes    Hollice Espy  [facility] Pager [phone]. If 7PM-7AM, please contact night-coverage at [email or web address], password [facility] 09/23/2012, 6:18 PM  LOS: 2 days

## 2012-09-23 NOTE — Progress Notes (Signed)
Patient was returning from using the restroom while using her home oxygen via a concentrator on 3L O2, RT checked SATs and patient was 77%. RT gave breathing treatment with xopenex and atrovent followed by symbicort inhaler. Placed patient back on 3l O2 for SATs of 93%, RT will continue to monitor

## 2012-09-24 DIAGNOSIS — R1310 Dysphagia, unspecified: Secondary | ICD-10-CM | POA: Diagnosis present

## 2012-09-24 LAB — CBC
HCT: 39.1 % (ref 36.0–46.0)
Hemoglobin: 12.7 g/dL (ref 12.0–15.0)
MCHC: 32.5 g/dL (ref 30.0–36.0)
WBC: 11.2 10*3/uL — ABNORMAL HIGH (ref 4.0–10.5)

## 2012-09-24 LAB — BASIC METABOLIC PANEL
BUN: 11 mg/dL (ref 6–23)
Chloride: 99 mEq/L (ref 96–112)
GFR calc Af Amer: >90 mL/min (ref >90)
GFR calc non Af Amer: 83 mL/min — ABNORMAL LOW (ref >90)
Potassium: 3.7 mEq/L (ref 3.5–5.1)
Sodium: 135 mEq/L (ref 135–145)

## 2012-09-24 MED ORDER — NYSTATIN 100000 UNIT/ML MT SUSP: 500000.0000 [IU] | Freq: Four times a day (QID) | OROMUCOSAL | Status: DC

## 2012-09-24 MED ORDER — IPRATROPIUM BROMIDE 0.02 % IN SOLN: 0.5000 mg | RESPIRATORY_TRACT | Status: DC | PRN

## 2012-09-24 MED ORDER — BENZONATATE 100 MG PO CAPS: 100.0000 mg | ORAL_CAPSULE | Freq: Three times a day (TID) | ORAL | Status: DC | PRN

## 2012-09-24 MED ORDER — LEVOFLOXACIN 750 MG PO TABS: 750.0000 mg | ORAL_TABLET | Freq: Every day | ORAL | Status: DC

## 2012-09-24 NOTE — Progress Notes (Addendum)
AVS reviewed with patient and patient's spouse and daughter.  Verbalized understanding of instructions.  Teach back method used.  Prescriptions provided to patient.  Pt's IV removed. Site WNL.  Pt has home O2 already.  Pt to be discharged with Home Health.  [facility] contacted.  Nurse spoke with Sue Lush, who verified that the orders had been received for patient.  No additional paperwork needed.  Pt transported by NT via w/c to main entrance for d/c.  Family to provide transport.  Pt stable at time of d/c.

## 2012-09-24 NOTE — Discharge Summary (Signed)
Physician Discharge Summary  Patient ID: Anita Hanson MRN: [id number] DOB/AGE: 74-09-1940 74 y.o.  Admit date: 09/21/2012 Discharge date: 09/24/2012  Discharge Diagnoses:  Principal Problem:   SIRS (systemic inflammatory response syndrome) Active Problems:   CAP (community acquired pneumonia)   Acute-on-chronic respiratory failure   COPD exacerbation   Osteoporosis   GERD (gastroesophageal reflux disease)   Other and unspecified hyperlipidemia   Thrush   Sciatica   Dysphagia, unspecified, chronic    Medication List    TAKE these medications       acetaminophen 500 MG tablet  Commonly known as:  TYLENOL  Take 500 mg by mouth every 6 (six) hours as needed for fever.     ALPRAZolam 0.5 MG tablet  Commonly known as:  XANAX  Take 0.5 mg by mouth at bedtime as needed for sleep.     atorvastatin 10 MG tablet  Commonly known as:  LIPITOR  Take 10 mg by mouth daily.     BONIVA IV  Inject into the vein every 3 (three) months.     budesonide-formoterol 160-4.5 MCG/ACT inhaler  Commonly known as:  SYMBICORT  Inhale 2 puffs into the lungs 2 (two) times daily.     ipratropium-albuterol 0.5-2.5 (3) MG/3ML Soln  Commonly known as:  DUONEB  Take 3 mLs by nebulization every 4 (four) hours as needed (shortness of breath).     levofloxacin 750 MG tablet  Commonly known as:  LEVAQUIN  Take 1 tablet (750 mg total) by mouth daily.     PRILOSEC OTC PO  1 tab po qd     PROVENTIL 90 MCG/ACT inhaler  Generic drug:  albuterol  Inhale 2 puffs into the lungs every 4 (four) hours as needed for wheezing or shortness of breath.     tiotropium 18 MCG inhalation capsule  Commonly known as:  SPIRIVA  Place 18 mcg into inhaler and inhale daily.     WOMENS TYLENOL PO  Take by mouth as needed.       Tessalon Perles 100 mg by mouth 3 times a day when necessary cough  Nystatin suspension 5 cc by mouth 4 times a day      Discharge Orders   Future Appointments Provider Department Dept  Phone   10/31/2012 12:45 PM [facility] Team [facility] [phone]   Future Orders Complete By Expires     Activity as tolerated - No restrictions  As directed     Diet - low sodium heart healthy  As directed        Follow-up Information   Follow up with LUKING,SCOTT, MD In 2 weeks.   Contact information:   [address] [place] [address] [phone]       Schedule an appointment as soon as possible for a visit with REHMAN,NAJEEB U, MD. (for swallowing difficulty)    Contact information:   [address], [address] [place] [address] [phone]       Disposition: home with home RN  Discharged Condition: Stable  Consults:   physical therapy  Labs:   Results for orders placed during the hospital encounter of 09/21/12 (from the past 48 hour(s))  CULTURE, EXPECTORATED SPUTUM-ASSESSMENT     Status: None   Collection Time    09/23/12  1:35 AM      Result Value Range   Specimen Description SPUTUM     Special Requests Normal     Sputum evaluation       Value:  MICROSCOPIC FINDINGS SUGGEST THAT THIS SPECIMEN IS NOT REPRESENTATIVE OF LOWER RESPIRATORY SECRETIONS. PLEASE RECOLLECT.   Report Status 09/23/2012 FINAL    CBC     Status: Abnormal   Collection Time    09/23/12  6:27 PM      Result Value Range   WBC 14.7 (*) 4.0 - 10.5 K/uL   RBC 4.33  3.87 - 5.11 MIL/uL   Hemoglobin 13.1  12.0 - 15.0 g/dL   HCT 16.1  09.6 - 04.5 %   MCV 92.4  78.0 - 100.0 fL   MCH 30.3  26.0 - 34.0 pg   MCHC 32.8  30.0 - 36.0 g/dL   RDW 40.9  81.1 - 91.4 %   Platelets 335  150 - 400 K/uL  BASIC METABOLIC PANEL     Status: Abnormal   Collection Time    09/24/12  5:42 AM      Result Value Range   Sodium 135  135 - 145 mEq/L   Potassium 3.7  3.5 - 5.1 mEq/L   Chloride 99  96 - 112 mEq/L   CO2 26  19 - 32 mEq/L   Glucose, Bld 99  70 - 99 mg/dL   BUN 11  6 - 23 mg/dL   Creatinine, Ser 7.82  0.50 - 1.10 mg/dL   Calcium 9.2  8.4 - 95.6 mg/dL   GFR calc non Af Amer  83 (*) >90 mL/min   GFR calc Af Amer >90  >90 mL/min   Comment:            The eGFR has been calculated     using the CKD EPI equation.     This calculation has not been     validated in all clinical     situations.     eGFR's persistently     <90 mL/min signify     possible Chronic Kidney Disease.  CBC     Status: Abnormal   Collection Time    09/24/12  5:42 AM      Result Value Range   WBC 11.2 (*) 4.0 - 10.5 K/uL   RBC 4.21  3.87 - 5.11 MIL/uL   Hemoglobin 12.7  12.0 - 15.0 g/dL   HCT 21.3  08.6 - 57.8 %   MCV 92.9  78.0 - 100.0 fL   MCH 30.2  26.0 - 34.0 pg   MCHC 32.5  30.0 - 36.0 g/dL   RDW 46.9  62.9 - 52.8 %   Platelets 319  150 - 400 K/uL    Diagnostics:  Ct Angio Chest Pe W/cm &/or Wo Cm  09/21/2012  *RADIOLOGY REPORT*  Clinical Data: Shortness of breath.  Chest and upper abdominal pain.  Nausea and fever.  CT ANGIOGRAPHY CHEST  Technique:  Multidetector CT imaging of the chest using the standard protocol during bolus administration of intravenous contrast. Multiplanar reconstructed images including MIPs were obtained and reviewed to evaluate the vascular anatomy.  Contrast: OMNIPAQUE IOHEXOL 350 MG/ML SOLN  Comparison: Chest radiograph 09/21/2012 and chest CT 11/07/2010.  Findings: No pulmonary embolus.  Mediastinal and hilar lymph nodes are not enlarged by CT size criteria.  No axillary adenopathy. Atherosclerotic calcification of the arterial vasculature, including coronary arteries.  Heart size normal.  No pericardial effusion.  Severe emphysema.  There are areas of airspace consolidation in the right middle lobe and right lower lobe.  Findings are worst in the medial right lower lobe.  4 mm subpleural nodular density in the right  lower lobe (image 60) appears unchanged.  A  3 mm subpleural nodular density in the inferior right upper lobe (image 50) is not well seen on the prior exam.  Scattered areas of mucoid impaction. No pleural fluid.  Airway is unremarkable.   Incidental imaging of the upper abdomen shows mild irregularity of the liver margin but the liver is incompletely imaged.  Small hiatal hernia.  No worrisome lytic or sclerotic lesions.  IMPRESSION:  1.  Negative for pulmonary embolus. 2.  Right lower lobe air space consolidation is likely due to pneumonia.  Follow-up to clearing is recommended. 3.  Tiny right upper lobe nodule is not well seen on the prior examination. Given risk factors for bronchogenic carcinoma, follow- up chest CT at 1 year is recommended.  This recommendation follows the consensus statement:  Guidelines for Management of Small Pulmonary Nodules Detected on CT Scans:  A Statement from the Fleischner Society as published in Radiology 2005; 237:395-400.   Original Report Authenticated By: Leanna Battles, M.D.    Dg Chest Port 1 View  09/21/2012  *RADIOLOGY REPORT*  Clinical Data: Shortness of breath.  PORTABLE CHEST - 1 VIEW  Comparison: CT chest 11/07/2010 and PA and lateral chest 10/28/2010.  Findings: The lungs are markedly emphysematous.  There is some bibasilar scarring which appears unchanged.  No pneumothorax or pleural fluid.  Heart size normal.  IMPRESSION: Severe emphysema without acute disease.   Original Report Authenticated By: Holley Dexter, M.D.   DNR   Hospital Course: See H&P for complete admission details. The patient is a 72 year old white female with COPD who presented with shortness of breath. Patient was hypoxic and had labored breathing. She was placed on BiPAP initially. Chest x-ray showed COPD, severe. CAT scan showed pneumonia. Patient was started on antibiotics, oxygen and bronchodilators. She steadily improved. She did report some choking spells which apparently is chronic for her. She has seen Dr. Dionicia Abler in the past who did an EGD and empiric dilatation. I encouraged her to followup with him. Patient developed thrush and was placed on treatment. By the time of discharge, she was able to ambulate to the  nursing station with a walker. She is feeling much better. Total time on the day of discharge greater than 30 minutes.``  Discharge Exam:  Blood pressure 133/80, pulse 109, temperature 98.3 F (36.8 C), temperature source Oral, resp. rate 20, height 5\' 2"  (1.575 m), weight 63 kg (138 lb 14.2 oz), SpO2 95.00%.  Lungs clear to auscultation bilaterally without wheeze rhonchi or rales Cardiovascular regular rate rhythm without murmurs gallops rubs Abdomen soft nontender nondistended Extremities no clubbing cyanosis or edema  Signed: Concettina Leth L 09/24/2012, 2:01 PM

## 2012-09-26 LAB — CULTURE, BLOOD (ROUTINE X 2): Culture: NO GROWTH

## 2012-09-30 ENCOUNTER — Encounter: Payer: Self-pay | Admitting: *Deleted

## 2012-10-05 ENCOUNTER — Ambulatory Visit (INDEPENDENT_AMBULATORY_CARE_PROVIDER_SITE_OTHER): Payer: Medicare Other | Admitting: Family Medicine

## 2012-10-05 ENCOUNTER — Encounter: Payer: Self-pay | Admitting: Family Medicine

## 2012-10-05 VITALS — BP 118/78 | Temp 97.9°F | Wt 137.0 lb

## 2012-10-05 DIAGNOSIS — J441 Chronic obstructive pulmonary disease with (acute) exacerbation: Secondary | ICD-10-CM

## 2012-10-05 DIAGNOSIS — R0600 Dyspnea, unspecified: Secondary | ICD-10-CM

## 2012-10-05 DIAGNOSIS — R0989 Other specified symptoms and signs involving the circulatory and respiratory systems: Secondary | ICD-10-CM

## 2012-10-05 DIAGNOSIS — R300 Dysuria: Secondary | ICD-10-CM

## 2012-10-05 DIAGNOSIS — J189 Pneumonia, unspecified organism: Secondary | ICD-10-CM

## 2012-10-05 LAB — POCT URINALYSIS DIPSTICK

## 2012-10-05 MED ORDER — TRAMADOL HCL 50 MG PO TABS: 50.0000 mg | ORAL_TABLET | Freq: Four times a day (QID) | ORAL | Status: DC | PRN

## 2012-10-05 MED ORDER — CIPROFLOXACIN HCL 500 MG PO TABS: 500.0000 mg | ORAL_TABLET | Freq: Two times a day (BID) | ORAL | Status: DC

## 2012-10-05 NOTE — Patient Instructions (Signed)
If fevers or worsening shortness of breath call and be seen.

## 2012-10-05 NOTE — Progress Notes (Signed)
Subjective:     Patient ID: Anita Hanson, female   DOB: 11/25/1940, 72 y.o.   MRN: [id number]  HPI This patient was in the hospital for several days with exacerbation of COPD she claims that no one told her what the results of any of her tests were ordered talked to her so she comes in today with many questions. We gladly answered every single question spending 30 minutes with the patient.  Patient was in the hospital with COPD and pneumonia no pulmonary embolism. She is now having some dysuria at times sweats denies fevers vomiting her breathing is back to her baseline  Review of Systems See above    Objective:   Physical Exam On exam her lungs are clear heart regular pulse normal extremities no edema skin warm dry neurologic grossly normal    Assessment:     Dysuria - Plan: POCT UA - Microscopic Only, POCT urinalysis dipstick, ciprofloxacin (CIPRO) 500 MG tablet  COPD exacerbation  Dyspnea  CAP (community acquired pneumonia) - Plan: CBC       Plan:     I do believe she would benefit from having a aide in the house. She also has severe COPD I like to see her back within a few weeks she was treated for a UTI as well. We will repeat her CBC because of some chills. Once again 30 minutes spent with this patient discussingher recent hospitalization her medicines and her current symptoms.

## 2012-10-06 LAB — CBC
HCT: 37.2 % (ref 36.0–46.0)
Platelets: 333 10*3/uL (ref 150–400)
RBC: 4.17 MIL/uL (ref 3.87–5.11)
RDW: 12.9 % (ref 11.5–15.5)
WBC: 8.9 10*3/uL (ref 4.0–10.5)

## 2012-10-07 ENCOUNTER — Encounter: Payer: Self-pay | Admitting: Family Medicine

## 2012-10-10 DIAGNOSIS — J189 Pneumonia, unspecified organism: Secondary | ICD-10-CM

## 2012-10-18 DIAGNOSIS — J441 Chronic obstructive pulmonary disease with (acute) exacerbation: Secondary | ICD-10-CM

## 2012-10-18 DIAGNOSIS — M810 Age-related osteoporosis without current pathological fracture: Secondary | ICD-10-CM

## 2012-10-18 DIAGNOSIS — K219 Gastro-esophageal reflux disease without esophagitis: Secondary | ICD-10-CM

## 2012-10-26 ENCOUNTER — Ambulatory Visit (INDEPENDENT_AMBULATORY_CARE_PROVIDER_SITE_OTHER): Payer: Medicare Other | Admitting: Family Medicine

## 2012-10-26 ENCOUNTER — Encounter: Payer: Self-pay | Admitting: Family Medicine

## 2012-10-26 VITALS — BP 166/94 | Temp 97.6°F | Wt 138.6 lb

## 2012-10-26 DIAGNOSIS — J441 Chronic obstructive pulmonary disease with (acute) exacerbation: Secondary | ICD-10-CM

## 2012-10-26 MED ORDER — NYSTATIN 100000 UNIT/ML MT SUSP: 500000.0000 [IU] | Freq: Four times a day (QID) | OROMUCOSAL | Status: DC

## 2012-10-26 MED ORDER — PREDNISONE (PAK) 5 MG PO TABS: 5.0000 mg | ORAL_TABLET | ORAL | Status: DC

## 2012-10-26 NOTE — Patient Instructions (Signed)
Prednisone 5mg  one daily recheck here in 1 month

## 2012-10-26 NOTE — Progress Notes (Signed)
  Subjective:    Patient ID: Anita Hanson, female    DOB: [birth year], 72 y.o.   MRN: [id number]  HPIno fevers, still with coughing.staying inside. Appetite fair.  This patient has COPD. She was in the hospital here we saw her for followup. She even had a nurse come out to see her but that really did not seem to help anything. Then they will not be a will to afford ongoing assistance in. In addition to this she does state significant shortness of breath with minimal activity she's frustrated by it. She understands that she is in the last  Phases of COPD although she could live for several more years.  Hx copd Review of Systems ROS see per above.    Objective:   Physical Exam 130/82 Lungs diminished airway is both sides some wheezing heart regular pulse normal extremities no edema skin warm dry neurologic grossly normal      Assessment & Plan:  COPD-we are going to go ahead and add prednisone 5 mg once a day everyday see if that doesn't help. There is a risk of this causing  Osteoporosis and sugars to go up not patient is in that in stage days currently and will need increased treatment to try to help her situation. She will continue all her other inhalers I will see her back in one month's time.

## 2012-10-27 ENCOUNTER — Telehealth: Payer: Self-pay | Admitting: Family Medicine

## 2012-10-27 NOTE — Telephone Encounter (Signed)
Patient notified that pharmacy does have med and pharmacy said it is ready for pick up.

## 2012-10-27 NOTE — Telephone Encounter (Signed)
Patient says she was supposed to get something called in yesterday for thrush in her mouth but the pharmacy says they didn't receive anything.  [facility]

## 2012-10-31 ENCOUNTER — Encounter (HOSPITAL_COMMUNITY): Payer: Medicare Other | Attending: Family Medicine

## 2012-10-31 VITALS — BP 113/71 | HR 92

## 2012-10-31 DIAGNOSIS — M810 Age-related osteoporosis without current pathological fracture: Secondary | ICD-10-CM | POA: Insufficient documentation

## 2012-10-31 MED ORDER — IBANDRONATE SODIUM 3 MG/3ML IV SOLN
3.0000 mg | Freq: Once | INTRAVENOUS | Status: AC
  Administered 2012-10-31: 3 mg via INTRAVENOUS

## 2012-10-31 NOTE — Progress Notes (Signed)
Alleen Borne presents today for injection per MD orders. boniva 3 mg administered iv  in left antecubital. Administration without incident. Patient tolerated well.

## 2012-11-25 ENCOUNTER — Encounter: Payer: Self-pay | Admitting: Family Medicine

## 2012-11-25 ENCOUNTER — Ambulatory Visit (INDEPENDENT_AMBULATORY_CARE_PROVIDER_SITE_OTHER): Payer: Medicare Other | Admitting: Family Medicine

## 2012-11-25 VITALS — BP 138/78 | HR 70 | Wt 139.0 lb

## 2012-11-25 DIAGNOSIS — J441 Chronic obstructive pulmonary disease with (acute) exacerbation: Secondary | ICD-10-CM

## 2012-11-25 DIAGNOSIS — R252 Cramp and spasm: Secondary | ICD-10-CM

## 2012-11-25 MED ORDER — PREDNISONE 5 MG PO TABS: 5.0000 mg | ORAL_TABLET | Freq: Every day | ORAL | Status: DC

## 2012-11-25 MED ORDER — METAXALONE 400 MG HALF TABLET: 800.0000 mg | ORAL_TABLET | Freq: Three times a day (TID) | ORAL | Status: DC

## 2012-11-25 MED ORDER — ALPRAZOLAM 0.5 MG PO TABS: 0.5000 mg | ORAL_TABLET | Freq: Every evening | ORAL | Status: DC | PRN

## 2012-11-25 NOTE — Progress Notes (Signed)
  Subjective:    Patient ID: Anita Hanson, female    DOB: 03/14/1941, 72 y.o.   MRN: [id number]  Shortness of Breath This is a chronic problem. The current episode started more than 1 year ago. The problem occurs constantly. The problem has been unchanged. Associated symptoms include orthopnea and wheezing. Pertinent negatives include no abdominal pain, ear pain, fever, hemoptysis, neck pain, sputum production, syncope or vomiting. The symptoms are aggravated by weather changes and lying flat. She has tried beta agonist inhalers, ipratropium inhalers, leukotriene antagonists and oral steroids for the symptoms. The treatment provided mild relief. Her past medical history is significant for allergies and COPD.      Review of Systems  Constitutional: Negative for fever.  HENT: Negative for ear pain and neck pain.   Respiratory: Positive for shortness of breath and wheezing. Negative for hemoptysis and sputum production.   Cardiovascular: Positive for orthopnea. Negative for syncope.  Gastrointestinal: Negative for vomiting and abdominal pain.       Objective:   Physical Exam Bilateral expiratory wheezes not in respiratory distress but it is apparent patient has severe disease heart regular extremities no edema skin warm dry pulses are normal blood pressure good       Assessment & Plan:  COPD-Patient is on maximal therapy she seemed to a benefited from the 5 mg of prednisone daily she states she tolerated it well. We will continue that. She was told if she starts having high fever chills or other problems followup immediately she is interested in finding out about stem cell therapy for the lungs this appears to be an investigational type study that some places or marketing. I'm really not sure this is wise for her we will look into it. Followup 3 months. Muscle cramps-She relates muscle cramps in her legs they get worse at times with certain movements. Worse at nighttime. She tried stretching  and didn't help. We will try Skelaxin 400 mg 3 times a day when necessary for night use only call us if problems hyperlipid-Continue current medication. Watch diet. No lab work indicated today.

## 2012-11-26 ENCOUNTER — Encounter: Payer: Self-pay | Admitting: Family Medicine

## 2012-11-26 LAB — BASIC METABOLIC PANEL
BUN: 15 mg/dL (ref 6–23)
Chloride: 99 mEq/L (ref 96–112)
Potassium: 5.3 mEq/L (ref 3.5–5.3)
Sodium: 137 mEq/L (ref 135–145)

## 2012-11-26 LAB — MAGNESIUM: Magnesium: 2.1 mg/dL (ref 1.5–2.5)

## 2012-12-07 ENCOUNTER — Ambulatory Visit (INDEPENDENT_AMBULATORY_CARE_PROVIDER_SITE_OTHER): Payer: Medicare Other | Admitting: Family Medicine

## 2012-12-07 ENCOUNTER — Encounter: Payer: Self-pay | Admitting: Family Medicine

## 2012-12-07 VITALS — BP 114/80 | Temp 98.1°F | Ht 62.0 in

## 2012-12-07 DIAGNOSIS — J441 Chronic obstructive pulmonary disease with (acute) exacerbation: Secondary | ICD-10-CM

## 2012-12-07 DIAGNOSIS — J329 Chronic sinusitis, unspecified: Secondary | ICD-10-CM

## 2012-12-07 MED ORDER — CEFTRIAXONE SODIUM 1 G IJ SOLR
1.0000 g | Freq: Once | INTRAMUSCULAR | Status: AC
  Administered 2012-12-07: 1 g via INTRAMUSCULAR

## 2012-12-07 MED ORDER — LEVOFLOXACIN 500 MG PO TABS: 500.0000 mg | ORAL_TABLET | Freq: Every day | ORAL | Status: AC

## 2012-12-07 MED ORDER — METHYLPREDNISOLONE ACETATE 40 MG/ML IJ SUSP
40.0000 mg | Freq: Once | INTRAMUSCULAR | Status: AC
  Administered 2012-12-07: 40 mg via INTRAMUSCULAR

## 2012-12-07 MED ORDER — PREDNISONE 20 MG PO TABS: ORAL_TABLET | ORAL | Status: DC

## 2012-12-07 NOTE — Progress Notes (Signed)
  Subjective:    Patient ID: Anita Hanson, female    DOB: Nov 06, 1940, 72 y.o.   MRN: [id number]  Cough This is a new problem. The current episode started in the past 7 days. The problem has been gradually worsening. The problem occurs constantly. The cough is productive of sputum. Associated symptoms include postnasal drip, shortness of breath and wheezing. Pertinent negatives include no fever. Associated symptoms comments: Insomnia, congestion. Nothing aggravates the symptoms. Treatments tried: breathing treatments. The treatment provided no relief.      Review of Systems  Constitutional: Positive for appetite change (decreased) and fatigue. Negative for fever.  HENT: Positive for postnasal drip and sinus pressure.   Eyes: Negative for itching.  Respiratory: Positive for cough, shortness of breath and wheezing.   Gastrointestinal: Negative for nausea, vomiting, diarrhea and rectal pain.  Endocrine: Negative for heat intolerance.  Musculoskeletal: Negative for back pain.  Skin: Negative for color change and pallor.       Objective:   Physical Exam  Vitals reviewed. HENT:  Head: Normocephalic.  Right Ear: External ear normal.  Left Ear: External ear normal.  Mouth/Throat: Oropharynx is clear and moist.  Neck: Normal range of motion. Neck supple.  Cardiovascular: Normal rate, regular rhythm and normal heart sounds.   No murmur heard. Pulmonary/Chest: Effort normal. She has wheezes (scattered).  Abdominal: Soft.  Musculoskeletal: She exhibits no edema.  Lymphadenopathy:    She has no cervical adenopathy.  Skin: She is not diaphoretic.          Assessment & Plan:  25 min spent discussing COPD and current poor health brochitis- levaquin, pred taper, rocephin, depomedrol [facility] referal- pt ? Stem cell use vs surgery options Recheck 1 week

## 2012-12-12 ENCOUNTER — Encounter: Payer: Self-pay | Admitting: Family Medicine

## 2012-12-12 ENCOUNTER — Ambulatory Visit (INDEPENDENT_AMBULATORY_CARE_PROVIDER_SITE_OTHER): Payer: Medicare Other | Admitting: Family Medicine

## 2012-12-12 VITALS — BP 142/88 | HR 103 | Wt 136.0 lb

## 2012-12-12 DIAGNOSIS — J441 Chronic obstructive pulmonary disease with (acute) exacerbation: Secondary | ICD-10-CM

## 2012-12-12 NOTE — Progress Notes (Signed)
  Subjective:    Patient ID: Anita Hanson, female    DOB: 06-15-1941, 72 y.o.   MRN: [id number]  HPI Patient in today with some congestion bringing up phlegm she is taking the medicine that we directed her to last week she has some low bit better she also has multiple questions regarding her COPD she also has a visit coming up with a specialist at [facility] in about a week and a half. PMH COPD, reflux, osteoporosis   Review of Systems See above. No worsening of her symptoms.    Objective:   Physical Exam  Lungs are clear her moods airfare heart regular neck no masses extremities no edema skin warm dry blood pressure acceptable      Assessment & Plan:  COPD exacerbation gradually getting better hopefully will improve over the course of the next couple weeks he [facility] specialist await the results followup within 3 months.

## 2012-12-27 ENCOUNTER — Telehealth: Payer: Self-pay | Admitting: Family Medicine

## 2012-12-27 NOTE — Telephone Encounter (Signed)
Nurses tell [facility] for now pt is thinking of this but doesn't want Hospice referal currently. She is certainly open to any help [facility] can give her or advise her of, thanks [employer]

## 2012-12-27 NOTE — Telephone Encounter (Signed)
Anita Hanson at [facility] says that Patient was advised that she was going to be referred to Hospice, but she checked with Hospice and they haven't received a referral. She was wanting to know if [facility] sent [place] the office notes and if we were going to be the ones to refer her to Hospice.

## 2012-12-27 NOTE — Telephone Encounter (Signed)
Patient said that you had some questions for her and if you can just give her a call back

## 2012-12-28 NOTE — Telephone Encounter (Signed)
I discussed issue with pt. She is considering Hospice, she will let us know.

## 2012-12-28 NOTE — Telephone Encounter (Signed)
Anita Hanson stated that they will start to see patient within the next 10 days and start helping her.

## 2013-01-02 ENCOUNTER — Telehealth: Payer: Self-pay | Admitting: *Deleted

## 2013-01-02 ENCOUNTER — Other Ambulatory Visit: Payer: Self-pay | Admitting: *Deleted

## 2013-01-02 NOTE — Telephone Encounter (Signed)
Spoke with patient via telephone.  Has an appointment with [place] for an Echo at 1130 June 25th,2014, f/u app to be scheculed with Dr. Lorin Picket 2-3 weeks from appointment.

## 2013-01-04 ENCOUNTER — Ambulatory Visit (HOSPITAL_COMMUNITY)
Admission: RE | Admit: 2013-01-04 | Discharge: 2013-01-04 | Disposition: A | Discharge status: Home / Self Care | Payer: Medicare Other | Source: Ambulatory Visit | Attending: Family Medicine | Admitting: Family Medicine

## 2013-01-04 DIAGNOSIS — R079 Chest pain, unspecified: Secondary | ICD-10-CM | POA: Insufficient documentation

## 2013-01-04 DIAGNOSIS — R0989 Other specified symptoms and signs involving the circulatory and respiratory systems: Secondary | ICD-10-CM | POA: Insufficient documentation

## 2013-01-04 DIAGNOSIS — J4489 Other specified chronic obstructive pulmonary disease: Secondary | ICD-10-CM | POA: Insufficient documentation

## 2013-01-04 DIAGNOSIS — I517 Cardiomegaly: Secondary | ICD-10-CM

## 2013-01-04 DIAGNOSIS — J449 Chronic obstructive pulmonary disease, unspecified: Secondary | ICD-10-CM | POA: Insufficient documentation

## 2013-01-04 DIAGNOSIS — R0609 Other forms of dyspnea: Secondary | ICD-10-CM | POA: Insufficient documentation

## 2013-01-04 NOTE — Progress Notes (Signed)
*  PRELIMINARY RESULTS* Echocardiogram 2D Echocardiogram has been performed.  [employer] 01/04/2013, 11:47 AM

## 2013-01-05 ENCOUNTER — Other Ambulatory Visit: Payer: Self-pay | Admitting: Family Medicine

## 2013-01-18 ENCOUNTER — Ambulatory Visit (INDEPENDENT_AMBULATORY_CARE_PROVIDER_SITE_OTHER): Payer: Medicare Other | Admitting: Family Medicine

## 2013-01-18 ENCOUNTER — Encounter: Payer: Self-pay | Admitting: Family Medicine

## 2013-01-18 DIAGNOSIS — J441 Chronic obstructive pulmonary disease with (acute) exacerbation: Secondary | ICD-10-CM

## 2013-01-18 NOTE — Progress Notes (Signed)
  Subjective:    Patient ID: Anita Hanson, female    DOB: 11-Oct-1940, 72 y.o.   MRN: [id number]  HPI Here for a follow up on ECHO  Concerned about weight loss. Taking ensure TID.  Discuss albuteral.     Review of Systems     Objective:   Physical Exam Lungs are clear patient slightly tachypneic heart regular extremities no edema skin warm dry no crackles Her weight is slightly down.      Assessment & Plan:  COPD-end-stage. Patient continued forward with current medicine. She currently has ipratropium with albuterol at [facility] Dr. told her to stop that. She has a 3 months supply I told her she could use it until that is gone. She is uncertain Spiriva is helping she will try for a few months and see she will followup here in approximately 3 months her echo showed does show some right ventricular hypertrophy but no sign of elevated pulmonary artery pressures care nurse is working with her regarding her issues

## 2013-01-31 ENCOUNTER — Ambulatory Visit (HOSPITAL_COMMUNITY)
Admission: RE | Admit: 2013-01-31 | Discharge: 2013-01-31 | Disposition: A | Discharge status: Home / Self Care | Payer: Medicare Other | Source: Ambulatory Visit | Attending: Family Medicine | Admitting: Family Medicine

## 2013-01-31 ENCOUNTER — Ambulatory Visit (HOSPITAL_COMMUNITY): Payer: Medicare Other

## 2013-01-31 DIAGNOSIS — M810 Age-related osteoporosis without current pathological fracture: Secondary | ICD-10-CM | POA: Insufficient documentation

## 2013-01-31 LAB — RENAL FUNCTION PANEL
CO2: 34 mEq/L — ABNORMAL HIGH (ref 19–32)
Calcium: 10.2 mg/dL (ref 8.4–10.5)
Creatinine, Ser: 0.62 mg/dL (ref 0.50–1.10)
GFR calc Af Amer: >90 mL/min (ref >90)
Glucose, Bld: 91 mg/dL (ref 70–99)

## 2013-01-31 MED ORDER — IBANDRONATE SODIUM 3 MG/3ML IV SOLN
3.0000 mg | Freq: Once | INTRAVENOUS | Status: AC
  Administered 2013-01-31: 3 mg via INTRAVENOUS

## 2013-01-31 MED ORDER — IBANDRONATE SODIUM 3 MG/3ML IV SOLN
INTRAVENOUS | Status: AC
  Filled 2013-01-31: qty 3

## 2013-01-31 NOTE — Progress Notes (Signed)
Results for Anita Hanson, Anita Hanson (MRN [id number]) as of 01/31/2013 14:10 per protocol renal panel prior to Boniva  Ref. Range 01/31/2013 10:47  Sodium Latest Range: 135-145 mEq/L 138  Potassium Latest Range: 3.5-5.1 mEq/L 4.4  Chloride Latest Range: 96-112 mEq/L 97  CO2 Latest Range: 19-32 mEq/L 34 (H)  BUN Latest Range: 6-23 mg/dL 13  Creatinine Latest Range: 0.50-1.10 mg/dL 2.13  Calcium Latest Range: 8.4-10.5 mg/dL 08.6  GFR calc non Af Amer Latest Range: >90 mL/min 87 (L)  GFR calc Af Amer Latest Range: >90 mL/min >90  Glucose Latest Range: 70-99 mg/dL 91  Phosphorus Latest Range: 2.3-4.6 mg/dL 3.9  Albumin Latest Range: 3.5-5.2 g/dL 3.6

## 2013-01-31 NOTE — Progress Notes (Signed)
Boniva infusion/renal panel tolerated well. Dx. Osteoporosis Dr. Gerda Diss

## 2013-02-27 ENCOUNTER — Other Ambulatory Visit: Payer: Self-pay | Admitting: Family Medicine

## 2013-04-05 ENCOUNTER — Ambulatory Visit (INDEPENDENT_AMBULATORY_CARE_PROVIDER_SITE_OTHER): Payer: Medicare Other | Admitting: Family Medicine

## 2013-04-05 ENCOUNTER — Encounter: Payer: Self-pay | Admitting: Family Medicine

## 2013-04-05 VITALS — BP 110/72 | Temp 97.8°F | Ht 61.0 in | Wt 141.0 lb

## 2013-04-05 DIAGNOSIS — J441 Chronic obstructive pulmonary disease with (acute) exacerbation: Secondary | ICD-10-CM

## 2013-04-05 MED ORDER — PREDNISONE 20 MG PO TABS: ORAL_TABLET | ORAL | Status: AC

## 2013-04-05 MED ORDER — METHYLPREDNISOLONE ACETATE 40 MG/ML IJ SUSP
40.0000 mg | Freq: Once | INTRAMUSCULAR | Status: AC
  Administered 2013-04-05: 40 mg via INTRAMUSCULAR

## 2013-04-05 MED ORDER — LEVOFLOXACIN 500 MG PO TABS: 500.0000 mg | ORAL_TABLET | Freq: Every day | ORAL | Status: AC

## 2013-04-05 NOTE — Progress Notes (Signed)
  Subjective:    Patient ID: Anita Hanson, female    DOB: 11-27-1940, 72 y.o.   MRN: [id number]  Cough The current episode started in the past 7 days. Associated symptoms include shortness of breath and wheezing. Treatments tried: inhalers  The treatment provided mild relief. Her past medical history is significant for bronchitis and COPD.   This patient has end-stage COPD she is even short of breath at rest it's worse over the past few days with increased cough and wheeze. No vomiting or diarrhea. Energy level subpar. Denies any high fever bringing up some discolored phlegm.  This patient cannot get around with her walker more than just a few feet. She is pretty much housebound. In order to improve the ability to get around it will be necessary for this patient to have a wheelchair that her husband to help push her around. Review of Systems  Respiratory: Positive for cough, shortness of breath and wheezing.        Objective:   Physical Exam  Lungs bilateral expiratory wheezes heart regular pulse normal abdomen soft extremities no edema      Assessment & Plan:  Exacerbation of COPD-Depo-Medrol shot prednisone taper antibiotic provided although I told him to give it one or 2 days if no sign of any type infection hold off on antibiotic otherwise could start it if things get worse warning signs were discussed keep regular followups flu shot at a later date

## 2013-04-26 ENCOUNTER — Other Ambulatory Visit: Payer: Self-pay | Admitting: Family Medicine

## 2013-04-26 DIAGNOSIS — R531 Weakness: Secondary | ICD-10-CM

## 2013-04-26 DIAGNOSIS — J439 Emphysema, unspecified: Secondary | ICD-10-CM

## 2013-04-26 NOTE — Progress Notes (Signed)
This patient has significant mobility limitations. The patient does have a caretaker who can help her propel her.

## 2013-05-02 ENCOUNTER — Encounter (HOSPITAL_COMMUNITY)
Admission: RE | Admit: 2013-05-02 | Discharge: 2013-05-02 | Disposition: A | Discharge status: Home / Self Care | Payer: Medicare Other | Source: Ambulatory Visit | Attending: Family Medicine | Admitting: Family Medicine

## 2013-05-02 DIAGNOSIS — M810 Age-related osteoporosis without current pathological fracture: Secondary | ICD-10-CM | POA: Insufficient documentation

## 2013-05-02 LAB — RENAL FUNCTION PANEL
BUN: 14 mg/dL (ref 6–23)
CO2: 29 mEq/L (ref 19–32)
Chloride: 95 mEq/L — ABNORMAL LOW (ref 96–112)
Creatinine, Ser: 0.65 mg/dL (ref 0.50–1.10)
GFR calc non Af Amer: 86 mL/min — ABNORMAL LOW (ref >90)

## 2013-05-02 MED ORDER — IBANDRONATE SODIUM 3 MG/3ML IV SOLN
3.0000 mg | Freq: Once | INTRAVENOUS | Status: AC
  Administered 2013-05-02: 3 mg via INTRAVENOUS

## 2013-05-02 MED ORDER — SODIUM CHLORIDE 0.9 % IJ SOLN
10.0000 mL | INTRAMUSCULAR | Status: AC | PRN
  Administered 2013-05-02: 10 mL

## 2013-05-02 MED ORDER — IBANDRONATE SODIUM 3 MG/3ML IV SOLN
INTRAVENOUS | Status: AC
  Filled 2013-05-02: qty 3

## 2013-05-02 NOTE — Progress Notes (Signed)
DX  osteoporosis

## 2013-05-02 NOTE — Progress Notes (Signed)
Here for boniva injection and renal panel draw.

## 2013-05-02 NOTE — Progress Notes (Signed)
Results for MARISSAH, VANDEMARK (MRN [id number]) as of 05/02/2013 14:26  Ref. Range 05/02/2013 11:00  Sodium Latest Range: 135-145 mEq/L 135  Potassium Latest Range: 3.5-5.1 mEq/L 4.7  Chloride Latest Range: 96-112 mEq/L 95 (L)  CO2 Latest Range: 19-32 mEq/L 29  BUN Latest Range: 6-23 mg/dL 14  Creatinine Latest Range: 0.50-1.10 mg/dL 4.09  Calcium Latest Range: 8.4-10.5 mg/dL 9.8  GFR calc non Af Amer Latest Range: >90 mL/min 86 (L)  GFR calc Af Amer Latest Range: >90 mL/min >90  Glucose Latest Range: 70-99 mg/dL 93  Phosphorus Latest Range: 2.3-4.6 mg/dL 3.5  Albumin Latest Range: 3.5-5.2 g/dL 3.5  Boniva given. R Modesta Sammons RN

## 2013-05-02 NOTE — Progress Notes (Signed)
Renal panel drawn and sent to lab for results. Paper tape used to secure IV catheter right AC.

## 2013-05-02 NOTE — Progress Notes (Signed)
boniva injected. Flushed with saline. IV d/c'd. drsg to site. Paper tape used. Next appt made for 08/08/13 at 1400 per pt request. Card given to pt. Tolerated well.

## 2013-05-04 ENCOUNTER — Other Ambulatory Visit: Payer: Self-pay | Admitting: Family Medicine

## 2013-05-18 ENCOUNTER — Other Ambulatory Visit: Payer: Self-pay

## 2013-06-20 ENCOUNTER — Other Ambulatory Visit: Payer: Self-pay | Admitting: Family Medicine

## 2013-06-21 NOTE — Telephone Encounter (Signed)
Refill times 4 

## 2013-07-18 ENCOUNTER — Other Ambulatory Visit: Payer: Self-pay | Admitting: Family Medicine

## 2013-07-31 ENCOUNTER — Other Ambulatory Visit: Payer: Self-pay | Admitting: Family Medicine

## 2013-08-07 ENCOUNTER — Encounter: Payer: Self-pay | Admitting: Family Medicine

## 2013-08-07 ENCOUNTER — Telehealth: Payer: Self-pay | Admitting: Family Medicine

## 2013-08-07 ENCOUNTER — Ambulatory Visit (INDEPENDENT_AMBULATORY_CARE_PROVIDER_SITE_OTHER): Payer: Medicare Other | Admitting: Family Medicine

## 2013-08-07 VITALS — BP 134/78 | HR 117 | Temp 98.7°F | Ht 61.0 in | Wt 140.0 lb

## 2013-08-07 DIAGNOSIS — R062 Wheezing: Secondary | ICD-10-CM

## 2013-08-07 DIAGNOSIS — J441 Chronic obstructive pulmonary disease with (acute) exacerbation: Secondary | ICD-10-CM

## 2013-08-07 MED ORDER — CEFTRIAXONE SODIUM 1 G IJ SOLR
500.0000 mg | Freq: Once | INTRAMUSCULAR | Status: AC
  Administered 2013-08-07: 500 mg via INTRAMUSCULAR

## 2013-08-07 MED ORDER — LEVOFLOXACIN 500 MG PO TABS: 500.0000 mg | ORAL_TABLET | Freq: Every day | ORAL | Status: DC

## 2013-08-07 MED ORDER — CHLORZOXAZONE 500 MG PO TABS: 500.0000 mg | ORAL_TABLET | Freq: Three times a day (TID) | ORAL | Status: DC | PRN

## 2013-08-07 MED ORDER — PREDNISONE 20 MG PO TABS: ORAL_TABLET | ORAL | Status: DC

## 2013-08-07 MED ORDER — METHYLPREDNISOLONE ACETATE 40 MG/ML IJ SUSP
40.0000 mg | Freq: Once | INTRAMUSCULAR | Status: AC
  Administered 2013-08-07: 40 mg via INTRAMUSCULAR

## 2013-08-07 NOTE — Telephone Encounter (Signed)
Patient has appointment tomorrow at [facility] to pick up her boniva need signature on paperwork before they can fill it.

## 2013-08-07 NOTE — Progress Notes (Signed)
   Subjective:    Patient ID: Anita BorneMary B Hanson, female    DOB: 02-14-1941, 73 y.o.   MRN: [id number]  Cough This is a new problem. The current episode started in the past 7 days. Associated symptoms include shortness of breath and wheezing. Her past medical history is significant for COPD.   she is here today with her daughter Anita Hanson The home health nurse Anita Hanson saw her earlier today Discuss changing muscle relaxer. Talking chloraxazone but wants to change bc of cost. Poor rest No high fevers she relates mainly coughing congestion wheezing. She states he gets short of breath with doing a [place] states at rest is more tolerable. She denies any other particular problems currently  PMH severe COPD.  Review of Systems  Respiratory: Positive for cough, shortness of breath and wheezing.        Objective:   Physical Exam  Nursing note and vitals reviewed. Constitutional: She appears well-developed.  HENT:  Head: Normocephalic.  Nose: Nose normal.  Mouth/Throat: Oropharynx is clear and moist. No oropharyngeal exudate.  Neck: Neck supple.  Cardiovascular: Normal rate and normal heart sounds.   No murmur heard. Pulmonary/Chest: Effort normal. She has wheezes.  Lymphadenopathy:    She has no cervical adenopathy.  Skin: Skin is warm and dry.   resp rate 28 , HR 114  Nebulizer treatment was given after iodide when her that I came back and listened to her again and discuss the results with family and with her.     Assessment & Plan:  #1 severe flare of COPD-I. am very concerned about this patient. We talked about possibly going to the ER the hospital are doing x-rays patient does not want to do that at this point in time will do Rocephin 500 mg IM along with Depo-Medrol 40 mg IM along with a prednisone taper over the next 9 days if not seen significant improvement over the next 48 hours then she is to followup with us immediately she is to call us if any particular problems. She is aware of  all this.  If all goes well she needs to followup by March.  We did change her muscle relaxer Parafon Forte K. which he uses in the evening time she denies abusing it

## 2013-08-08 ENCOUNTER — Encounter (HOSPITAL_COMMUNITY): Admission: RE | Admit: 2013-08-08 | Payer: Medicare Other | Source: Ambulatory Visit

## 2013-08-15 ENCOUNTER — Encounter (HOSPITAL_COMMUNITY): Admission: RE | Admit: 2013-08-15 | Payer: Medicare Other | Source: Ambulatory Visit

## 2013-08-22 ENCOUNTER — Encounter (HOSPITAL_COMMUNITY)
Admission: RE | Admit: 2013-08-22 | Discharge: 2013-08-22 | Disposition: A | Discharge status: Home / Self Care | Payer: Medicare Other | Source: Ambulatory Visit | Attending: Family Medicine | Admitting: Family Medicine

## 2013-08-22 ENCOUNTER — Encounter (HOSPITAL_COMMUNITY): Payer: Medicare Other

## 2013-08-22 ENCOUNTER — Encounter (HOSPITAL_COMMUNITY): Payer: Self-pay

## 2013-08-22 DIAGNOSIS — M810 Age-related osteoporosis without current pathological fracture: Secondary | ICD-10-CM | POA: Insufficient documentation

## 2013-08-22 LAB — RENAL FUNCTION PANEL
Albumin: 3.2 g/dL — ABNORMAL LOW (ref 3.5–5.2)
BUN: 12 mg/dL (ref 6–23)
CALCIUM: 9.5 mg/dL (ref 8.4–10.5)
CO2: 32 meq/L (ref 19–32)
CREATININE: 0.69 mg/dL (ref 0.50–1.10)
Chloride: 98 mEq/L (ref 96–112)
GFR calc Af Amer: >90 mL/min (ref >90)
GFR, EST NON AFRICAN AMERICAN: 84 mL/min — AB (ref >90)
GLUCOSE: 105 mg/dL — AB (ref 70–99)
PHOSPHORUS: 3.9 mg/dL (ref 2.3–4.6)
Potassium: 4.8 mEq/L (ref 3.7–5.3)
Sodium: 140 mEq/L (ref 137–147)

## 2013-08-22 MED ORDER — IBANDRONATE SODIUM 3 MG/3ML IV SOLN
INTRAVENOUS | Status: AC
  Filled 2013-08-22: qty 3

## 2013-08-22 MED ORDER — SODIUM CHLORIDE 0.9 % IJ SOLN
10.0000 mL | Freq: Once | INTRAMUSCULAR | Status: AC
  Administered 2013-08-22: 10 mL via INTRAVENOUS

## 2013-08-22 MED ORDER — IBANDRONATE SODIUM 3 MG/3ML IV SOLN
3.0000 mg | Freq: Once | INTRAVENOUS | Status: AC
  Administered 2013-08-22: 3 mg via INTRAVENOUS

## 2013-08-25 NOTE — Progress Notes (Signed)
Results for Anita Hanson, Anita Hanson (MRN [id number]) as of 08/25/2013 06:49 Boniva 3mg  IV given. Lab results per protocol.  Ref. Range 08/22/2013 13:30  Sodium Latest Range: 137-147 mEq/L 140  Potassium Latest Range: 3.7-5.3 mEq/L 4.8  Chloride Latest Range: 96-112 mEq/L 98  CO2 Latest Range: 19-32 mEq/L 32  BUN Latest Range: 6-23 mg/dL 12  Creatinine Latest Range: 0.50-1.10 mg/dL 4.090.69  Calcium Latest Range: 8.4-10.5 mg/dL 9.5  GFR calc non Af Amer Latest Range: >90 mL/min 84 (L)  GFR calc Af Amer Latest Range: >90 mL/min >90  Glucose Latest Range: 70-99 mg/dL 811105 (H)  Phosphorus Latest Range: 2.3-4.6 mg/dL 3.9  Albumin Latest Range: 3.5-5.2 g/dL 3.2 (L)

## 2013-09-19 ENCOUNTER — Ambulatory Visit (INDEPENDENT_AMBULATORY_CARE_PROVIDER_SITE_OTHER): Payer: Medicare Other | Admitting: Family Medicine

## 2013-09-19 ENCOUNTER — Encounter: Payer: Self-pay | Admitting: Family Medicine

## 2013-09-19 VITALS — BP 136/90 | Ht 61.0 in | Wt 143.0 lb

## 2013-09-19 DIAGNOSIS — K2970 Gastritis, unspecified, without bleeding: Secondary | ICD-10-CM

## 2013-09-19 DIAGNOSIS — K2990 Gastroduodenitis, unspecified, without bleeding: Secondary | ICD-10-CM

## 2013-09-19 DIAGNOSIS — J441 Chronic obstructive pulmonary disease with (acute) exacerbation: Secondary | ICD-10-CM

## 2013-09-19 LAB — POCT HEMOGLOBIN: Hemoglobin: 11.4 g/dL — AB (ref 12.2–16.2)

## 2013-09-19 MED ORDER — PANTOPRAZOLE SODIUM 40 MG PO TBEC: 40.0000 mg | DELAYED_RELEASE_TABLET | Freq: Every day | ORAL | Status: DC

## 2013-09-19 MED ORDER — ALBUTEROL SULFATE (2.5 MG/3ML) 0.083% IN NEBU: 2.5000 mg | INHALATION_SOLUTION | Freq: Four times a day (QID) | RESPIRATORY_TRACT | Status: AC

## 2013-09-19 NOTE — Progress Notes (Signed)
   Subjective:    Patient ID: Anita Hanson, female    DOB: 02/24/1941, 73 y.o.   MRN: [id number]  HPI Patient is here today for a check up.  She needs a refill on her albuterol. She went to [facility] back in June and they told her to be on a different RX for albuterol. So she doesn't know if she should go with what [facility] said or with what you said. She doesn't know which one works best with her.  Pt also c/o burning in her stomach. She has had this in the past and it was a stomach ulcer. Maalox made the pain go away, but she wanted to get your opinion first since the pain started again 3 days ago.     Review of Systems Denies high fever chills sweats nausea vomiting diarrhea. Relates some stomach pain discomfort. Is on prednisone high-risk for ulcer taking Zantac currently.    Objective:   Physical Exam  her lungs are clear hearts regular pulse normal extremities no edema       Assessment & Plan:  COPD exacerbation - Plan: POCT hemoglobin  She is also having some epigastric pain discomfort her hemoglobin looks good I doubt she's having type of hemorrhage from it. We will go ahead with omeprazole on a daily basis followup again in a few weeks time if still having pain then referral for EGD  COPD stable continue current measures this was reviewed her albuterol was ordered for a nebulizer  25 minutes spent with patient reviewing over her problems discussing them with her her husband and the daughter.

## 2013-09-27 ENCOUNTER — Other Ambulatory Visit: Payer: Self-pay | Admitting: Family Medicine

## 2013-09-28 ENCOUNTER — Telehealth: Payer: Self-pay | Admitting: Family Medicine

## 2013-09-28 MED ORDER — OMEPRAZOLE 20 MG PO CPDR: DELAYED_RELEASE_CAPSULE | ORAL | Status: DC

## 2013-09-28 MED ORDER — TIOTROPIUM BROMIDE MONOHYDRATE 18 MCG IN CAPS: ORAL_CAPSULE | RESPIRATORY_TRACT | Status: DC

## 2013-09-28 MED ORDER — BUDESONIDE-FORMOTEROL FUMARATE 160-4.5 MCG/ACT IN AERO: INHALATION_SPRAY | RESPIRATORY_TRACT | Status: DC

## 2013-09-28 NOTE — Telephone Encounter (Signed)
Rxs sent electronically to pharmacy. Patient notified. 

## 2013-09-28 NOTE — Telephone Encounter (Signed)
Patient needs Rx for simbicort, omeprazole, and spiriva to [facility]  Fax- [phone]  Insurance # [id number]  Reference # [id number]

## 2013-10-02 ENCOUNTER — Other Ambulatory Visit: Payer: Self-pay | Admitting: *Deleted

## 2013-10-03 ENCOUNTER — Encounter: Payer: Self-pay | Admitting: Family Medicine

## 2013-10-03 ENCOUNTER — Ambulatory Visit (INDEPENDENT_AMBULATORY_CARE_PROVIDER_SITE_OTHER): Payer: Medicare Other | Admitting: Family Medicine

## 2013-10-03 VITALS — BP 130/70 | Ht 61.0 in | Wt 143.2 lb

## 2013-10-03 DIAGNOSIS — E785 Hyperlipidemia, unspecified: Secondary | ICD-10-CM

## 2013-10-03 DIAGNOSIS — D649 Anemia, unspecified: Secondary | ICD-10-CM

## 2013-10-03 DIAGNOSIS — J441 Chronic obstructive pulmonary disease with (acute) exacerbation: Secondary | ICD-10-CM

## 2013-10-03 DIAGNOSIS — M810 Age-related osteoporosis without current pathological fracture: Secondary | ICD-10-CM

## 2013-10-03 DIAGNOSIS — K219 Gastro-esophageal reflux disease without esophagitis: Secondary | ICD-10-CM

## 2013-10-03 MED ORDER — TRAMADOL HCL 50 MG PO TABS: 50.0000 mg | ORAL_TABLET | Freq: Four times a day (QID) | ORAL | Status: DC | PRN

## 2013-10-03 MED ORDER — CITALOPRAM HYDROBROMIDE 10 MG PO TABS
10.0000 mg | ORAL_TABLET | Freq: Every day | ORAL | Status: DC
Start: 2013-10-03 — End: 2013-11-28

## 2013-10-03 MED ORDER — ALPRAZOLAM 0.5 MG PO TABS: ORAL_TABLET | ORAL | Status: DC

## 2013-10-03 MED ORDER — CHLORZOXAZONE 500 MG PO TABS: 500.0000 mg | ORAL_TABLET | Freq: Three times a day (TID) | ORAL | Status: DC | PRN

## 2013-10-03 NOTE — Progress Notes (Signed)
   Subjective:    Patient ID: Anita Hanson, female    DOB: 11-Oct-1940, 73 y.o.   MRN: [id number]  Gastrophageal Reflux She complains of coughing. She reports no abdominal pain, no chest pain or no wheezing. This is a recurrent problem. The current episode started 1 to 4 weeks ago. The problem has been gradually improving. Nothing aggravates the symptoms. Pertinent negatives include no fatigue. There are no known risk factors. She has tried a PPI for the symptoms. The treatment provided significant relief.  Patient is here for a follow up visit on this issue. Patient needs a refill on her alprazolam, tramadol and chlorzoxazone. Patient has no other concerns at this time during this visit.   They states that epigastric area is actually doing better compared where was.  25 minutes easily was spent discussing depression with patient she's been having fatigue tiredness poor energy poor outlook after long discussion she agrees to being on antidepressant she denies being suicidal  Her COPD is stable although it's very severe and long-term prognosis is poor  Has hyperlipidemia but does not want to treat it states that she feels that the inconsequential She has osteoporosis she goes through treatments but she does need a bone density.  Review of Systems  Constitutional: Negative for fever, activity change, appetite change and fatigue.  HENT: Negative for congestion, ear pain and rhinorrhea.   Eyes: Negative for discharge.  Respiratory: Positive for cough. Negative for shortness of breath and wheezing.   Cardiovascular: Negative for chest pain.  Gastrointestinal: Negative for abdominal pain.  Endocrine: Negative for polydipsia and polyphagia.  Genitourinary: Negative for frequency.  Neurological: Negative for weakness.  Psychiatric/Behavioral: Negative for confusion.       Objective:   Physical Exam  Vitals reviewed. Constitutional: She appears well-nourished. No distress.  Cardiovascular:  Normal rate, regular rhythm and normal heart sounds.   No murmur heard. Pulmonary/Chest: Effort normal and breath sounds normal. No respiratory distress.  Musculoskeletal: She exhibits no edema.  Lymphadenopathy:    She has no cervical adenopathy.  Neurological: She is alert. She exhibits normal muscle tone.  Psychiatric: Her behavior is normal.          Assessment & Plan:  Depression-we will start a medication. This patient does have depression she would benefit from being on medication warning signs were discussed followup again in 3-4 weeks if suicidal or other problems should arise followup immediately Hyper;ip-patient does not want to treat hyperlipidemia she feels that this is inconsequential given her overall poor health. Osteopor-bone density ordered. Continue current therapy. Anemia-on recent hemoglobin it was low we will go ahead and check blood work await the results of this gastritis-doing well on pantoprazole she is to continue this. Stop omeprazole.  25 minutes spent with patient

## 2013-10-05 ENCOUNTER — Ambulatory Visit (HOSPITAL_COMMUNITY)
Admission: RE | Admit: 2013-10-05 | Discharge: 2013-10-05 | Disposition: A | Discharge status: Home / Self Care | Payer: Medicare Other | Source: Ambulatory Visit | Attending: Family Medicine | Admitting: Family Medicine

## 2013-10-05 DIAGNOSIS — Z780 Asymptomatic menopausal state: Secondary | ICD-10-CM | POA: Insufficient documentation

## 2013-10-05 DIAGNOSIS — M818 Other osteoporosis without current pathological fracture: Secondary | ICD-10-CM | POA: Insufficient documentation

## 2013-10-05 LAB — CBC WITH DIFFERENTIAL/PLATELET
BASOS PCT: 1 % (ref 0–1)
Basophils Absolute: 0.1 10*3/uL (ref 0.0–0.1)
EOS ABS: 0.2 10*3/uL (ref 0.0–0.7)
EOS PCT: 2 % (ref 0–5)
HCT: 38.2 % (ref 36.0–46.0)
HEMOGLOBIN: 12.8 g/dL (ref 12.0–15.0)
LYMPHS ABS: 1.2 10*3/uL (ref 0.7–4.0)
Lymphocytes Relative: 10 % — ABNORMAL LOW (ref 12–46)
MCH: 30.4 pg (ref 26.0–34.0)
MCHC: 33.5 g/dL (ref 30.0–36.0)
MCV: 90.7 fL (ref 78.0–100.0)
MONOS PCT: 3 % (ref 3–12)
Monocytes Absolute: 0.3 10*3/uL (ref 0.1–1.0)
Neutro Abs: 9.7 10*3/uL — ABNORMAL HIGH (ref 1.7–7.7)
Neutrophils Relative %: 84 % — ABNORMAL HIGH (ref 43–77)
Platelets: 357 10*3/uL (ref 150–400)
RBC: 4.21 MIL/uL (ref 3.87–5.11)
RDW: 12.8 % (ref 11.5–15.5)
WBC: 11.5 10*3/uL — ABNORMAL HIGH (ref 4.0–10.5)

## 2013-10-06 LAB — IRON AND TIBC
%SAT: 25 % (ref 20–55)
Iron: 86 ug/dL (ref 42–145)
TIBC: 339 ug/dL (ref 250–470)
UIBC: 253 ug/dL (ref 125–400)

## 2013-10-06 LAB — FERRITIN: Ferritin: 97 ng/mL (ref 10–291)

## 2013-10-13 ENCOUNTER — Other Ambulatory Visit: Payer: Self-pay | Admitting: Family Medicine

## 2013-10-23 ENCOUNTER — Other Ambulatory Visit: Payer: Self-pay | Admitting: Family Medicine

## 2013-10-24 ENCOUNTER — Telehealth: Payer: Self-pay

## 2013-10-24 ENCOUNTER — Ambulatory Visit (INDEPENDENT_AMBULATORY_CARE_PROVIDER_SITE_OTHER): Payer: Medicare Other | Admitting: Family Medicine

## 2013-10-24 ENCOUNTER — Encounter: Payer: Self-pay | Admitting: Family Medicine

## 2013-10-24 VITALS — BP 120/70 | Ht 61.0 in | Wt 143.2 lb

## 2013-10-24 DIAGNOSIS — R911 Solitary pulmonary nodule: Secondary | ICD-10-CM

## 2013-10-24 DIAGNOSIS — F329 Major depressive disorder, single episode, unspecified: Secondary | ICD-10-CM

## 2013-10-24 DIAGNOSIS — F32A Depression, unspecified: Secondary | ICD-10-CM

## 2013-10-24 DIAGNOSIS — F3289 Other specified depressive episodes: Secondary | ICD-10-CM

## 2013-10-24 MED ORDER — ALPRAZOLAM 0.5 MG PO TABS: ORAL_TABLET | ORAL | Status: DC

## 2013-10-24 NOTE — Telephone Encounter (Signed)
Patient notified that CT chest is scheduled for October 31, 2013 at 8 :00 am. Patient verbalized understanding.

## 2013-10-24 NOTE — Telephone Encounter (Signed)
Left message on voicemail to return call. Need to schedule CT chest pulmonary nodule protocol (follow up from CT in March 2014).

## 2013-10-24 NOTE — Progress Notes (Addendum)
   Subjective:    Patient ID: Anita Hanson, female    DOB: 10/16/1940, 73 y.o.   MRN: [id number]  HPI Patient is here for a follow up visit on depression. Currently taking Celexa. Patient states that she can't tell if the medication is helping or not.  Patient states that she is having sharp pains in her right shoulder. It has been present for about 1-2 weeks.    Review of Systems  Constitutional: Negative for fever, activity change, appetite change and fatigue.  Respiratory: Positive for cough and shortness of breath.   Cardiovascular: Negative for chest pain.  Gastrointestinal: Negative for abdominal pain.  Endocrine: Negative for polydipsia and polyphagia.  Genitourinary: Negative for frequency.  Neurological: Negative for weakness.  Psychiatric/Behavioral: Negative for confusion.       Objective:   Physical Exam  Vitals reviewed. Constitutional: She appears well-nourished. No distress.  Cardiovascular: Normal rate, regular rhythm and normal heart sounds.   No murmur heard. Pulmonary/Chest: Effort normal and breath sounds normal. No respiratory distress.  Musculoskeletal: She exhibits no edema.  Lymphadenopathy:    She has no cervical adenopathy.  Neurological: She is alert. She exhibits normal muscle tone.  Psychiatric: Her behavior is normal.          Assessment & Plan:  #1 depression-not seen significant improvement so far but seems to be tolerating medication she's only been on a short span were given a couple more weeks if not showing significant improvement in the next couple weeks either up the dosage or change to a different medication. Followup sooner problems.  #2 COPD stable. #3 intermittent right shoulder pain no need for any type of intervention currently gentle range of motion Tylenol when necessary Refills of Xanax sent in  Should also be noted that the CT exam 1 year ago showed pulmonary nodule she needs a repeat CT. We will go ahead and do a CT  noncontrast  It should be noted that this full note was dictated by Dr. Lilyan PuntScott Luking. The first couple lines were put in by the nurse. The rest were put in by myself. I agree 100% with a whole note

## 2013-10-24 NOTE — Telephone Encounter (Signed)
Refill this +5 refills

## 2013-10-31 ENCOUNTER — Encounter: Payer: Self-pay | Admitting: Family Medicine

## 2013-10-31 ENCOUNTER — Ambulatory Visit (HOSPITAL_COMMUNITY)
Admission: RE | Admit: 2013-10-31 | Discharge: 2013-10-31 | Disposition: A | Discharge status: Home / Self Care | Payer: Medicare Other | Source: Ambulatory Visit | Attending: Family Medicine | Admitting: Family Medicine

## 2013-10-31 DIAGNOSIS — R911 Solitary pulmonary nodule: Secondary | ICD-10-CM | POA: Insufficient documentation

## 2013-11-16 ENCOUNTER — Telehealth: Payer: Self-pay

## 2013-11-16 MED ORDER — CHLORZOXAZONE 500 MG PO TABS: 500.0000 mg | ORAL_TABLET | Freq: Three times a day (TID) | ORAL | Status: DC | PRN

## 2013-11-16 MED ORDER — TRAMADOL HCL 50 MG PO TABS: 50.0000 mg | ORAL_TABLET | Freq: Four times a day (QID) | ORAL | Status: DC | PRN

## 2013-11-16 NOTE — Telephone Encounter (Signed)
Please go ahead and give 4 refills on each medication. Thank you

## 2013-11-16 NOTE — Telephone Encounter (Signed)
Mrs. Anita Hanson decided to give it a few days and see if her discomfort gets better. She needs a refill on her Tramadol 50 mg and Chlorzoxazone 500 [facility] She will follow up with us if she is no better by next week. Called Marja KaysAlisa Gilboy and got no answer so I will route a copy of this message to her also.

## 2013-11-16 NOTE — Telephone Encounter (Signed)
Patient notified medication refills were sent to pharmacy.

## 2013-11-16 NOTE — Telephone Encounter (Signed)
Message copied by Dereck LigasJOHNSON, CALANDRA P on Thu Nov 16, 2013 10:57 AM ------      Message from: Nunzio CoryGILBOY, ALISA G      Created: Thu Nov 16, 2013 10:28 AM       Hi Dorthula Perfectalandra,             I sent a note to Dr. Lorin PicketScott yesterday and came by the office (he was seeing patients; I know he was the only doc in yesterday) re: Mrs. Ladona Ridgelaylor. She had a coughing episode (sounds like brochospasm by her description) and has had significant rib cage (left) and back pain since. 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 Would you mind passing this information and my note from yesterday along to Dr. Lorin PicketScott (I briefly saw Beth in the office - note was addressed to her attention).             Thank you!      Artelia Larochelisa             Alisa Gilboy MHA,BSN, RN,CCM      [facility]       [phone]              ------

## 2013-11-16 NOTE — Telephone Encounter (Signed)
Please call Mrs. Anita Hanson fine now with the pain and discomfort there 2 things we could do she could give it a few more days and see if he gets better in 10 next week or we could order x-ray of the ribs to look at it. If there is a crack in the ribs unfortunately there really isn't much we can do for it. Also if things are not getting better over the next several days I would be happy to see her please talk with her and find out what she would like to get done. Then call the [facility] and person and let them know as well

## 2013-11-20 ENCOUNTER — Other Ambulatory Visit (HOSPITAL_COMMUNITY): Payer: Medicare Other

## 2013-11-21 ENCOUNTER — Encounter (HOSPITAL_COMMUNITY)
Admission: RE | Admit: 2013-11-21 | Discharge: 2013-11-21 | Disposition: A | Discharge status: Home / Self Care | Payer: Medicare Other | Source: Ambulatory Visit | Attending: Family Medicine | Admitting: Family Medicine

## 2013-11-21 DIAGNOSIS — M810 Age-related osteoporosis without current pathological fracture: Secondary | ICD-10-CM | POA: Insufficient documentation

## 2013-11-21 DIAGNOSIS — Z79899 Other long term (current) drug therapy: Secondary | ICD-10-CM | POA: Insufficient documentation

## 2013-11-21 LAB — RENAL FUNCTION PANEL
Albumin: 3.7 g/dL (ref 3.5–5.2)
BUN: 9 mg/dL (ref 6–23)
CHLORIDE: 95 meq/L — AB (ref 96–112)
CO2: 29 mEq/L (ref 19–32)
CREATININE: 0.6 mg/dL (ref 0.50–1.10)
Calcium: 9.9 mg/dL (ref 8.4–10.5)
GFR calc Af Amer: >90 mL/min (ref >90)
GFR, EST NON AFRICAN AMERICAN: 88 mL/min — AB (ref >90)
Glucose, Bld: 90 mg/dL (ref 70–99)
POTASSIUM: 4.7 meq/L (ref 3.7–5.3)
Phosphorus: 3.8 mg/dL (ref 2.3–4.6)
Sodium: 136 mEq/L — ABNORMAL LOW (ref 137–147)

## 2013-11-21 MED ORDER — SODIUM CHLORIDE 0.9 % IJ SOLN
10.0000 mL | INTRAMUSCULAR | Status: AC | PRN
  Administered 2013-11-21: 10 mL

## 2013-11-21 MED ORDER — SODIUM CHLORIDE 0.9 % IJ SOLN
INTRAMUSCULAR | Status: AC
  Filled 2013-11-21: qty 10

## 2013-11-21 MED ORDER — IBANDRONATE SODIUM 3 MG/3ML IV SOLN
INTRAVENOUS | Status: AC
  Filled 2013-11-21: qty 3

## 2013-11-21 MED ORDER — IBANDRONATE SODIUM 3 MG/3ML IV SOLN
3.0000 mg | Freq: Once | INTRAVENOUS | Status: AC
  Administered 2013-11-21: 3 mg via INTRAVENOUS

## 2013-11-21 NOTE — Progress Notes (Signed)
Results for Anita Hanson, Anita Hanson (MRN [id number]) as of 11/21/2013 14:31  Ref. Range 11/21/2013 13:15  Sodium Latest Range: 137-147 mEq/L 136 (L)  Potassium Latest Range: 3.7-5.3 mEq/L 4.7  Chloride Latest Range: 96-112 mEq/L 95 (L)  CO2 Latest Range: 19-32 mEq/L 29  BUN Latest Range: 6-23 mg/dL 9  Creatinine Latest Range: 0.50-1.10 mg/dL 4.090.60  Calcium Latest Range: 8.4-10.5 mg/dL 9.9  GFR calc non Af Amer Latest Range: >90 mL/min 88 (L)  GFR calc Af Amer Latest Range: >90 mL/min >90  Glucose Latest Range: 70-99 mg/dL 90  Phosphorus Latest Range: 2.3-4.6 mg/dL 3.8  Albumin Latest Range: 3.5-5.2 g/dL 3.7  Boniva infusion @ [facility] today

## 2013-11-23 ENCOUNTER — Telehealth: Payer: Self-pay | Admitting: *Deleted

## 2013-11-23 NOTE — Telephone Encounter (Signed)
Pt is still having back pain. Recommended follow up next week per Dr. Lorin PicketScott. Pt has an appt on Tuesday.

## 2013-11-28 ENCOUNTER — Ambulatory Visit (INDEPENDENT_AMBULATORY_CARE_PROVIDER_SITE_OTHER): Payer: Medicare Other | Admitting: Family Medicine

## 2013-11-28 ENCOUNTER — Encounter: Payer: Self-pay | Admitting: Family Medicine

## 2013-11-28 ENCOUNTER — Ambulatory Visit (HOSPITAL_COMMUNITY)
Admission: RE | Admit: 2013-11-28 | Discharge: 2013-11-28 | Disposition: A | Discharge status: Home / Self Care | Payer: Medicare Other | Source: Ambulatory Visit | Attending: Family Medicine | Admitting: Family Medicine

## 2013-11-28 VITALS — BP 102/64 | Ht 60.0 in | Wt 141.0 lb

## 2013-11-28 DIAGNOSIS — J438 Other emphysema: Secondary | ICD-10-CM

## 2013-11-28 DIAGNOSIS — M899 Disorder of bone, unspecified: Secondary | ICD-10-CM | POA: Insufficient documentation

## 2013-11-28 DIAGNOSIS — M51379 Other intervertebral disc degeneration, lumbosacral region without mention of lumbar back pain or lower extremity pain: Secondary | ICD-10-CM | POA: Insufficient documentation

## 2013-11-28 DIAGNOSIS — J439 Emphysema, unspecified: Secondary | ICD-10-CM | POA: Insufficient documentation

## 2013-11-28 DIAGNOSIS — I708 Atherosclerosis of other arteries: Secondary | ICD-10-CM | POA: Insufficient documentation

## 2013-11-28 DIAGNOSIS — M5430 Sciatica, unspecified side: Secondary | ICD-10-CM

## 2013-11-28 DIAGNOSIS — M5137 Other intervertebral disc degeneration, lumbosacral region: Secondary | ICD-10-CM | POA: Insufficient documentation

## 2013-11-28 DIAGNOSIS — R0781 Pleurodynia: Secondary | ICD-10-CM

## 2013-11-28 DIAGNOSIS — R079 Chest pain, unspecified: Secondary | ICD-10-CM | POA: Insufficient documentation

## 2013-11-28 DIAGNOSIS — M949 Disorder of cartilage, unspecified: Secondary | ICD-10-CM

## 2013-11-28 MED ORDER — CITALOPRAM HYDROBROMIDE 20 MG PO TABS: 10.0000 mg | ORAL_TABLET | Freq: Every day | ORAL | Status: DC

## 2013-11-28 MED ORDER — OXYCODONE-ACETAMINOPHEN 5-325 MG PO TABS: ORAL_TABLET | ORAL | Status: DC

## 2013-11-28 NOTE — Progress Notes (Signed)
   Subjective:    Patient ID: Anita Hanson, female    DOB: 14-Feb-1941, 73 y.o.   MRN: [id number]  HPIfollow up on depression. Taking celexa 10mg . moods are not doing good partly because of in pain all the time.  Left rib pain. Started May 3rd. Tramadol 50mg  q 6 hours and muscle relaxers. Hurts with deep breath hurts with cough or movement  Dry itchy skin on arms. Patient was told that she might be having some early skin changes that could be consistent with early skin sun damage changes. No sign of skin cancer.  Sciatica pain left leg. Getting worse.   Requesting tetanus vaccine.   Review of Systems     Objective:   Physical Exam Neck no masses lungs are clear heart is regular abdomen is soft extremities trace edema skin warm dry subjective discomfort in the left rib region mid thoracic section just below the scapula also subjective discomfort down the left leg       Assessment & Plan:  Severe sciatica-pain medication when necessary. Hopefully this will gradually get better offered referral to injection specialist she does not want to do that. I did go ahead and recommend x-rays of the lumbar spine patient does not want aggressive therapy  Rib pain x-rays ordered. Could have a fractured rib pain medication as necessary try half tablet first  COPD stable continue current measures  Antidepressant to increase the dose to 20 mg followup again

## 2013-12-01 ENCOUNTER — Other Ambulatory Visit: Payer: Self-pay

## 2013-12-01 ENCOUNTER — Telehealth: Payer: Self-pay | Admitting: Family Medicine

## 2013-12-01 MED ORDER — CITALOPRAM HYDROBROMIDE 20 MG PO TABS: 20.0000 mg | ORAL_TABLET | Freq: Every day | ORAL | Status: DC

## 2013-12-01 NOTE — Telephone Encounter (Signed)
ERROR

## 2013-12-15 ENCOUNTER — Telehealth: Payer: Self-pay | Admitting: Family Medicine

## 2013-12-15 NOTE — Telephone Encounter (Signed)
Nurses please ignore this request, resent to [facility]

## 2013-12-15 NOTE — Telephone Encounter (Signed)
pantoprazole (PROTONIX) 40 MG tablet   Pt states her ins [employer] is now requiring a prior authorization For this med  Please call this number to get it started  [phone]  Pt is down to 2 pills, an will only take a few mins if we call them

## 2013-12-15 NOTE — Telephone Encounter (Signed)
Rx prior auth APPROVED for pt's PANTOPRAZOLE 40mg  daily, expires 12/16/14 per [facility], [facility], called and notified pt

## 2014-01-03 ENCOUNTER — Encounter: Payer: Self-pay | Admitting: Family Medicine

## 2014-01-03 ENCOUNTER — Ambulatory Visit (INDEPENDENT_AMBULATORY_CARE_PROVIDER_SITE_OTHER): Payer: Medicare Other | Admitting: Family Medicine

## 2014-01-03 VITALS — BP 130/84 | Ht 60.0 in | Wt 140.0 lb

## 2014-01-03 DIAGNOSIS — F325 Major depressive disorder, single episode, in full remission: Secondary | ICD-10-CM

## 2014-01-03 DIAGNOSIS — R059 Cough, unspecified: Secondary | ICD-10-CM

## 2014-01-03 DIAGNOSIS — R05 Cough: Secondary | ICD-10-CM

## 2014-01-03 DIAGNOSIS — J438 Other emphysema: Secondary | ICD-10-CM

## 2014-01-03 MED ORDER — OXYCODONE-ACETAMINOPHEN 5-325 MG PO TABS: ORAL_TABLET | ORAL | Status: AC

## 2014-01-03 MED ORDER — BENZONATATE 200 MG PO CAPS: 200.0000 mg | ORAL_CAPSULE | Freq: Three times a day (TID) | ORAL | Status: DC | PRN

## 2014-01-03 NOTE — Progress Notes (Signed)
   Subjective:    Patient ID: Anita Hanson, female    DOB: 07/07/1941, 73 y.o.   MRN: [id number]  HPIFollow up on depression. celexa was increased to 20mg at last visit. Patient states med is helping. She states her depression aspect is doing very well.  [facility] for neb kit to [facility]. She uses a nebulizer she needs a prescription in order to get this.  Refill on oxycodone. Takes as needed.  Pain is better. She would like to have a few on hand just in case she needs it.  Itching on both sides of neck and on face. She has multiple skin tags that are giving her some troubles.  Requesting refill on tessalon pearls. Having cough at night. Started awhile ago. Off and on. Not bringing up any discolored phlegm. PMH noted. Review of Systems  Constitutional: Negative for activity change, appetite change and fatigue.  HENT: Negative for congestion.   Respiratory: Positive for cough and shortness of breath. Negative for choking and chest tightness.   Cardiovascular: Negative for chest pain.  Gastrointestinal: Negative for abdominal pain.  Endocrine: Negative for polydipsia and polyphagia.  Genitourinary: Negative for frequency.  Neurological: Negative for weakness.  Psychiatric/Behavioral: Negative for confusion.       Objective:   Physical Exam  Vitals reviewed. Constitutional: She appears well-nourished. No distress.  Cardiovascular: Normal rate, regular rhythm and normal heart sounds.   No murmur heard. Pulmonary/Chest: Effort normal and breath sounds normal. No respiratory distress.  Musculoskeletal: She exhibits no edema.  Lymphadenopathy:    She has no cervical adenopathy.  Neurological: She is alert. She exhibits normal muscle tone.  Psychiatric: Her behavior is normal.          Assessment & Plan:  Severe COPD oxygen dependent. Continue inhalers stay out of the heat report to us if any problems  #2 rib injury seemingly healed up I did give her prescription for  additional oxycodone in case this causes pain again she is using it responsibly keep it in a safe spot.  #3 depression overall patient is doing well sticking with her medication not having issues.  #4 she does have worry and concern about her husband I encouraged her to set up an appointment for him to be seen  #5 she does have skin tags that around the neck that bother her but they're not bleeding or infected if she chooses to remove them in the future we can help her.  #6 viral syndrome Tessalon for cough if discolored phlegm fever chills or worse followup immediately. 

## 2014-01-25 ENCOUNTER — Telehealth: Payer: Self-pay | Admitting: Family Medicine

## 2014-01-25 MED ORDER — HYDROCODONE-HOMATROPINE 5-1.5 MG/5ML PO SYRP: ORAL_SOLUTION | ORAL | Status: DC

## 2014-01-25 NOTE — Telephone Encounter (Signed)
Patient says that the tessalon pearls is not helping her with her cough. She is not coughing anything up, just a dry cough. She is requesting something for her cough for her to take at night. Something mild, not something that will completely knock her out.   [facility] [place]

## 2014-01-25 NOTE — Telephone Encounter (Signed)
Only other option is Hycodan one half to 1 teaspoon each bedtime, 4 ounces, and use on a when necessary basis. Do not take at the same time his oxycodone. Can cause drowsiness. If any problems notify us for followup

## 2014-01-25 NOTE — Telephone Encounter (Signed)
Script ready for pickup. Pt notified.  

## 2014-01-26 ENCOUNTER — Other Ambulatory Visit: Payer: Self-pay | Admitting: Family Medicine

## 2014-02-14 ENCOUNTER — Other Ambulatory Visit: Payer: Self-pay | Admitting: Family Medicine

## 2014-02-14 NOTE — Telephone Encounter (Signed)
Approve in scott's name plus two ref

## 2014-02-26 ENCOUNTER — Other Ambulatory Visit: Payer: Self-pay | Admitting: Family Medicine

## 2014-02-27 ENCOUNTER — Encounter (HOSPITAL_COMMUNITY)
Admission: RE | Admit: 2014-02-27 | Discharge: 2014-02-27 | Disposition: A | Discharge status: Home / Self Care | Payer: Medicare Other | Source: Ambulatory Visit | Attending: Family Medicine | Admitting: Family Medicine

## 2014-02-27 DIAGNOSIS — M810 Age-related osteoporosis without current pathological fracture: Secondary | ICD-10-CM | POA: Insufficient documentation

## 2014-02-27 LAB — RENAL FUNCTION PANEL
ALBUMIN: 3.6 g/dL (ref 3.5–5.2)
ANION GAP: 10 (ref 5–15)
BUN: 12 mg/dL (ref 6–23)
CALCIUM: 9.7 mg/dL (ref 8.4–10.5)
CO2: 30 mEq/L (ref 19–32)
Chloride: 93 mEq/L — ABNORMAL LOW (ref 96–112)
Creatinine, Ser: 0.62 mg/dL (ref 0.50–1.10)
GFR calc Af Amer: >90 mL/min (ref >90)
GFR calc non Af Amer: 87 mL/min — ABNORMAL LOW (ref >90)
Glucose, Bld: 98 mg/dL (ref 70–99)
POTASSIUM: 5 meq/L (ref 3.7–5.3)
Phosphorus: 4.2 mg/dL (ref 2.3–4.6)
SODIUM: 133 meq/L — AB (ref 137–147)

## 2014-02-27 MED ORDER — SODIUM CHLORIDE 0.9 % IJ SOLN
10.0000 mL | INTRAMUSCULAR | Status: DC | PRN
  Administered 2014-02-27: 10 mL via INTRAVENOUS

## 2014-02-27 MED ORDER — IBANDRONATE SODIUM 3 MG/3ML IV SOLN
3.0000 mg | Freq: Once | INTRAVENOUS | Status: AC
  Administered 2014-02-27: 3 mg via INTRAVENOUS
  Filled 2014-02-27: qty 3

## 2014-02-27 NOTE — Progress Notes (Signed)
Renal panel results faxed to Dr Scott Luking. 

## 2014-03-02 ENCOUNTER — Encounter: Payer: Self-pay | Admitting: Nurse Practitioner

## 2014-03-02 ENCOUNTER — Ambulatory Visit (INDEPENDENT_AMBULATORY_CARE_PROVIDER_SITE_OTHER): Payer: Medicare Other | Admitting: Nurse Practitioner

## 2014-03-02 VITALS — BP 130/82 | Ht 60.0 in | Wt 139.0 lb

## 2014-03-02 DIAGNOSIS — G4700 Insomnia, unspecified: Secondary | ICD-10-CM

## 2014-03-02 DIAGNOSIS — R300 Dysuria: Secondary | ICD-10-CM

## 2014-03-02 LAB — POCT URINALYSIS DIPSTICK
PH UA: 6
SPEC GRAV UA: 1.015

## 2014-03-02 MED ORDER — NYSTATIN 100000 UNIT/GM EX CREA: 1.0000 "application " | TOPICAL_CREAM | Freq: Two times a day (BID) | CUTANEOUS | Status: AC

## 2014-03-02 MED ORDER — CIPROFLOXACIN HCL 500 MG PO TABS: 500.0000 mg | ORAL_TABLET | Freq: Two times a day (BID) | ORAL | Status: DC

## 2014-03-02 NOTE — Patient Instructions (Signed)
Melatonin extended release Try 2 of the xanax at bedtime

## 2014-03-05 LAB — POCT UA - MICROSCOPIC ONLY
Bacteria, U Microscopic: NEGATIVE
Epithelial cells, urine per micros: NEGATIVE
RBC, URINE, MICROSCOPIC: NEGATIVE

## 2014-03-06 LAB — URINE CULTURE

## 2014-03-07 ENCOUNTER — Encounter: Payer: Self-pay | Admitting: Nurse Practitioner

## 2014-03-07 ENCOUNTER — Other Ambulatory Visit: Payer: Self-pay | Admitting: Nurse Practitioner

## 2014-03-07 DIAGNOSIS — G4700 Insomnia, unspecified: Secondary | ICD-10-CM | POA: Insufficient documentation

## 2014-03-07 MED ORDER — CEFDINIR 300 MG PO CAPS: 300.0000 mg | ORAL_CAPSULE | Freq: Two times a day (BID) | ORAL | Status: DC

## 2014-03-07 NOTE — Progress Notes (Signed)
Subjective:  Presents for complaints of odor to her urine for the past few days. Off-and-on mid pelvic discomfort. No fever. No flank or back pain. No history of recent UTIs. No vaginal discharge. Has chronic urinary incontinence, wears pads daily. Also requesting some cream for external irritation. Take Xanax 0.5 mg at bedtime which only gives her about an hour of sleep. Also taking melatonin 3 mg with no relief. Has chronic insomnia.  Objective:   BP 130/82  Ht 5' (1.524 m)  Wt 139 lb (63.05 kg)  BMI 27.15 kg/m2 NAD. Alert, oriented. Wearing oxygen. Exam somewhat limited, patient is in a wheelchair. Lungs clear. No CVA area tenderness. Heart regular rate rhythm. Abdomen soft nondistended nontender. Urine micro-rare WBC otherwise normal.  Assessment: Dysuria - Plan: POCT urinalysis dipstick, POCT UA - Microscopic Only, Urine culture  Insomnia  Plan:  Meds ordered this encounter  Medications  . ciprofloxacin (CIPRO) 500 MG tablet    Sig: Take 1 tablet (500 mg total) by mouth 2 (two) times daily.    Dispense:  14 tablet    Refill:  0    Order Specific Question:  Supervising Provider    Answer:  Merlyn Albert [[id number]]  . nystatin cream (MYCOSTATIN)    Sig: Apply 1 application topically 2 (two) times daily.    Dispense:  30 g    Refill:  0    Order Specific Question:  Supervising Provider    Answer:  Merlyn Albert [[id number]]   Tri-extended release melatonin or take 2 Xanax at bedtime for sleep. Explained that we have to be careful not to over sedate due to her breathing issues. Daughter is present with her today. Warning signs reviewed. Call back in 72 hours if no improvement, sooner if worse.

## 2014-03-22 ENCOUNTER — Other Ambulatory Visit: Payer: Self-pay | Admitting: Family Medicine

## 2014-04-16 ENCOUNTER — Other Ambulatory Visit: Payer: Self-pay | Admitting: Family Medicine

## 2014-04-27 ENCOUNTER — Telehealth: Payer: Self-pay | Admitting: Family Medicine

## 2014-04-27 NOTE — Telephone Encounter (Signed)
Patient said that she has a breaking out on her behind.  Is requesting that we call in fanny cream to [facility].

## 2014-04-27 NOTE — Telephone Encounter (Signed)
ok 

## 2014-04-27 NOTE — Telephone Encounter (Signed)
Med called in. Patient notified.

## 2014-04-30 ENCOUNTER — Telehealth: Payer: Self-pay | Admitting: Family Medicine

## 2014-04-30 NOTE — Telephone Encounter (Signed)
Just please clarify; my notes indicate she should be taking Xanax 0.5 mg 2 pills at bedtime. If this is correct, I will write new Rx for Xanax 1 mg which is the same thing.

## 2014-04-30 NOTE — Telephone Encounter (Signed)
Patient states she was told to take 2 pills at bedtime of the xanax 0.5mg 

## 2014-04-30 NOTE — Telephone Encounter (Signed)
Patient says that the last time that she saw Eber JonesCarolyn, her alprazolam Rx was changed from taking 1 time at night to 2 times at night.  She said that she is going to run out of her medicine soon because the last Rx that she had reflected the 1 time at night and she needs this fixed at the pharmacy. Please advise[facility]  [facility]

## 2014-05-01 MED ORDER — ALPRAZOLAM 1 MG PO TABS: 1.0000 mg | ORAL_TABLET | Freq: Every evening | ORAL | Status: DC | PRN

## 2014-05-01 NOTE — Telephone Encounter (Signed)
Please call in refill for Xanax 1 mg one po qhs prn sleep (#30 with 2 refills). Thanks.

## 2014-05-01 NOTE — Telephone Encounter (Signed)
Rx faxed to pharmacy. Patient notified. 

## 2014-05-17 ENCOUNTER — Other Ambulatory Visit: Payer: Self-pay | Admitting: Family Medicine

## 2014-05-17 MED ORDER — BUDESONIDE-FORMOTEROL FUMARATE 160-4.5 MCG/ACT IN AERO: INHALATION_SPRAY | RESPIRATORY_TRACT | Status: DC

## 2014-05-17 NOTE — Telephone Encounter (Signed)
[facility] for a "BRIDGE" for her budesonide-formoterol (SYMBICORT) 160-4.5 MCG/ACT inhaler for her to have filled to use until her mail order can be delivered.  Please note on Rx that it is a "BRIDGE" so her insurance will cover,  Please call pt when done [phone]. (we already ordered her mail order, this request is for her local pharmacy)

## 2014-05-17 NOTE — Addendum Note (Signed)
Addended by: Dereck LigasJOHNSON, Hadden Steig P on: 05/17/2014 03:11 PM   Modules accepted: Orders

## 2014-05-17 NOTE — Telephone Encounter (Signed)
Prescription sent to pharmacy. Patient was notified.

## 2014-05-18 ENCOUNTER — Other Ambulatory Visit: Payer: Self-pay | Admitting: *Deleted

## 2014-05-18 ENCOUNTER — Telehealth: Payer: Self-pay | Admitting: Family Medicine

## 2014-05-18 MED ORDER — BUDESONIDE-FORMOTEROL FUMARATE 160-4.5 MCG/ACT IN AERO: INHALATION_SPRAY | RESPIRATORY_TRACT | Status: AC

## 2014-05-18 NOTE — Telephone Encounter (Signed)
Med sent to pharm. Pt notified.  

## 2014-05-18 NOTE — Telephone Encounter (Signed)
budesonide-formoterol (SYMBICORT) 160-4.5 MCG/ACT inhaler  [facility] but needs her normal 90 day supply resent to [facility] So she can get back on track with them for this med   Call pt when sent

## 2014-05-28 ENCOUNTER — Other Ambulatory Visit: Payer: Self-pay | Admitting: Family Medicine

## 2014-05-28 MED ORDER — PREDNISONE 5 MG PO TABS: ORAL_TABLET | ORAL | Status: AC

## 2014-05-28 MED ORDER — CHLORZOXAZONE 500 MG PO TABS: 500.0000 mg | ORAL_TABLET | Freq: Three times a day (TID) | ORAL | Status: AC | PRN

## 2014-05-28 MED ORDER — PANTOPRAZOLE SODIUM 40 MG PO TBEC: 40.0000 mg | DELAYED_RELEASE_TABLET | Freq: Every day | ORAL | Status: AC

## 2014-05-28 MED ORDER — TRAMADOL HCL 50 MG PO TABS: 50.0000 mg | ORAL_TABLET | Freq: Four times a day (QID) | ORAL | Status: AC | PRN

## 2014-05-28 MED ORDER — ALPRAZOLAM 1 MG PO TABS
1.0000 mg | ORAL_TABLET | Freq: Every evening | ORAL | Status: DC | PRN
Start: 2014-05-28 — End: 2014-06-04

## 2014-05-28 NOTE — Telephone Encounter (Signed)
[facility] send over new script for   Prednisone, Pantaprazole, Chlorzoxazone, tramadol, Xanax  Please call pt when this is done

## 2014-05-28 NOTE — Telephone Encounter (Signed)
Has an appt scheduled for 11/23

## 2014-05-28 NOTE — Telephone Encounter (Signed)
Last seen 03/02/14 (sick) 

## 2014-05-28 NOTE — Telephone Encounter (Signed)
May send refills 5 per med to new pharmacy

## 2014-05-28 NOTE — Telephone Encounter (Signed)
Patient notified that refills will be sent in by the end of the day.

## 2014-05-29 ENCOUNTER — Encounter (HOSPITAL_COMMUNITY)
Admission: RE | Admit: 2014-05-29 | Discharge: 2014-05-29 | Disposition: A | Discharge status: Home / Self Care | Payer: Medicare Other | Source: Ambulatory Visit | Attending: Family Medicine | Admitting: Family Medicine

## 2014-05-29 DIAGNOSIS — M810 Age-related osteoporosis without current pathological fracture: Secondary | ICD-10-CM | POA: Insufficient documentation

## 2014-05-29 LAB — RENAL FUNCTION PANEL
ALBUMIN: 3.6 g/dL (ref 3.5–5.2)
ANION GAP: 11 (ref 5–15)
BUN: 10 mg/dL (ref 6–23)
CHLORIDE: 98 meq/L (ref 96–112)
CO2: 30 meq/L (ref 19–32)
CREATININE: 0.63 mg/dL (ref 0.50–1.10)
Calcium: 9.8 mg/dL (ref 8.4–10.5)
GFR calc Af Amer: >90 mL/min (ref >90)
GFR calc non Af Amer: 86 mL/min — ABNORMAL LOW (ref >90)
GLUCOSE: 88 mg/dL (ref 70–99)
POTASSIUM: 4.6 meq/L (ref 3.7–5.3)
Phosphorus: 3.1 mg/dL (ref 2.3–4.6)
Sodium: 139 mEq/L (ref 137–147)

## 2014-05-29 MED ORDER — IBANDRONATE SODIUM 3 MG/3ML IV SOLN
3.0000 mg | Freq: Once | INTRAVENOUS | Status: AC
  Administered 2014-05-29: 3 mg via INTRAVENOUS

## 2014-05-29 MED ORDER — IBANDRONATE SODIUM 3 MG/3ML IV SOLN
INTRAVENOUS | Status: AC
  Filled 2014-05-29: qty 3

## 2014-05-29 NOTE — Progress Notes (Signed)
Results for Anita Hanson, Anita Hanson (MRN [id number]) as of 05/29/2014 15:03  Ref. Range 05/29/2014 13:19  Sodium Latest Range: 137-147 mEq/L 139  Potassium Latest Range: 3.7-5.3 mEq/L 4.6  Chloride Latest Range: 96-112 mEq/L 98  CO2 Latest Range: 19-32 mEq/L 30  BUN Latest Range: 6-23 mg/dL 10  Creatinine Latest Range: 0.50-1.10 mg/dL 4.090.63  Calcium Latest Range: 8.4-10.5 mg/dL 9.8  GFR calc non Af Amer Latest Range: >90 mL/min 86 (L)  GFR calc Af Amer Latest Range: >90 mL/min >90  Glucose Latest Range: 70-99 mg/dL 88  Anion gap Latest Range: 5-15  11  Phosphorus Latest Range: 2.3-4.6 mg/dL 3.1  Albumin Latest Range: 3.5-5.2 g/dL 3.6

## 2014-05-29 NOTE — Progress Notes (Signed)
Here for lab and boniva injection. Dx osteoporosis.

## 2014-06-04 ENCOUNTER — Ambulatory Visit (INDEPENDENT_AMBULATORY_CARE_PROVIDER_SITE_OTHER): Payer: Medicare Other | Admitting: Family Medicine

## 2014-06-04 DIAGNOSIS — J441 Chronic obstructive pulmonary disease with (acute) exacerbation: Secondary | ICD-10-CM

## 2014-06-04 DIAGNOSIS — J438 Other emphysema: Secondary | ICD-10-CM

## 2014-06-04 MED ORDER — CEFPROZIL 500 MG PO TABS: 500.0000 mg | ORAL_TABLET | Freq: Two times a day (BID) | ORAL | Status: DC

## 2014-06-04 MED ORDER — CITALOPRAM HYDROBROMIDE 20 MG PO TABS: 20.0000 mg | ORAL_TABLET | Freq: Every day | ORAL | Status: AC

## 2014-06-04 MED ORDER — PREDNISONE 20 MG PO TABS: ORAL_TABLET | ORAL | Status: DC

## 2014-06-04 MED ORDER — ALPRAZOLAM 1 MG PO TABS: 1.0000 mg | ORAL_TABLET | Freq: Every evening | ORAL | Status: AC | PRN

## 2014-06-04 NOTE — Progress Notes (Signed)
   Subjective:    Patient ID: Anita BorneMary B Paulus, female    DOB: [birth year], 73 y.o.   MRN: [id number]  HPI Patient arrives for a consult. Patient needs FMLA filled out for daughter to help mother at home. Patient states she needs help with ADLs and other household chores due to declining health.  Patient is noticing increased shortness of breath recently along with mucoid drainage. Denies high fever chills. Review of Systems Patient relates shortness of breath cough wheezing denies any chest pressure tightness pain    Objective:   Physical Exam  Bilateral expiratory wheezes heart is regular pulse normal extremities no edema skin warm dry neurologic gross normal      Assessment & Plan:  #1 severe end-stage COPD-has exacerbation prednisone taper antibiotics prescribed warning signs discuss follow-up if worse  #2 end-of-life issues-patient does not want to be on a ventilator. She does not one currently to be hospitalized.  #3 FMLA papers were filled out for the daughter who will be staying with the mother  #4 medications were refilled. 25 minutes spent with family [phone]

## 2014-06-14 ENCOUNTER — Encounter: Payer: Self-pay | Admitting: Family Medicine

## 2014-06-27 ENCOUNTER — Telehealth: Payer: Self-pay | Admitting: Family Medicine

## 2014-06-27 MED ORDER — HYDROCODONE-HOMATROPINE 5-1.5 MG/5ML PO SYRP: ORAL_SOLUTION | ORAL | Status: DC

## 2014-06-27 NOTE — Telephone Encounter (Signed)
Nurses, please do 4 hours prescription teaspoon every 6 hours when necessary severe cough caution drowsiness

## 2014-06-27 NOTE — Telephone Encounter (Signed)
Rx up front for patient pick up. Patient notified. 

## 2014-06-27 NOTE — Telephone Encounter (Signed)
Patient needs [facility] for hydrocodone cough syrup.

## 2014-06-27 NOTE — Addendum Note (Signed)
Addended by: Margaretha SheffieldBROWN, AUTUMN S on: 06/27/2014 03:35 PM   Modules accepted: Orders

## 2014-06-27 NOTE — Telephone Encounter (Signed)
Patient reports that she can take Hycodan and has had no problems with this in past. Patient reports rebound headaches with long term hydrocodone use.

## 2014-07-03 ENCOUNTER — Other Ambulatory Visit: Payer: Self-pay | Admitting: *Deleted

## 2014-07-03 ENCOUNTER — Telehealth: Payer: Self-pay

## 2014-07-03 MED ORDER — ALBUTEROL SULFATE HFA 108 (90 BASE) MCG/ACT IN AERS: 2.0000 | INHALATION_SPRAY | RESPIRATORY_TRACT | Status: AC | PRN

## 2014-07-03 NOTE — Telephone Encounter (Signed)
Pt called requesting a rx for proventil to be sent over to [facility].

## 2014-07-03 NOTE — Telephone Encounter (Signed)
Med sent to pharm for proventil inhaler. Pt notified.

## 2014-07-10 ENCOUNTER — Ambulatory Visit (INDEPENDENT_AMBULATORY_CARE_PROVIDER_SITE_OTHER): Payer: Medicare Other | Admitting: Family Medicine

## 2014-07-10 ENCOUNTER — Encounter: Payer: Self-pay | Admitting: Family Medicine

## 2014-07-10 VITALS — BP 110/68 | Temp 98.0°F | Ht 60.0 in

## 2014-07-10 DIAGNOSIS — J441 Chronic obstructive pulmonary disease with (acute) exacerbation: Secondary | ICD-10-CM

## 2014-07-10 DIAGNOSIS — Z7189 Other specified counseling: Secondary | ICD-10-CM

## 2014-07-10 MED ORDER — PREDNISONE 20 MG PO TABS: ORAL_TABLET | ORAL | Status: AC

## 2014-07-10 MED ORDER — CEFPROZIL 500 MG PO TABS: 500.0000 mg | ORAL_TABLET | Freq: Two times a day (BID) | ORAL | Status: AC

## 2014-07-10 MED ORDER — NYSTATIN 100000 UNIT/ML MT SUSP: OROMUCOSAL | Status: AC

## 2014-07-10 MED ORDER — HYDROCODONE-HOMATROPINE 5-1.5 MG/5ML PO SYRP: ORAL_SOLUTION | ORAL | Status: AC

## 2014-07-10 NOTE — Progress Notes (Signed)
   Subjective:    Patient ID: Anita Hanson, female    DOB: [birth year], 73 y.o.   MRN: [id number]  Cough This is a new problem. The current episode started in the past 7 days. Associated symptoms include a fever and wheezing. Associated symptoms comments: Nausea, vomiting, Congestion, fatigue.   patient has long history of COPD. Worse over the past couple days. Patient states at times she is tired of fighting. She does not one any extra means to prolong her life.  Review of Systems  Constitutional: Positive for fever.  Respiratory: Positive for cough and wheezing.        Objective:   Physical Exam Coughing up yellow-green phlegm deep cough noted some tachypnea at rest some congestion noted in the right base. Heart regular extremities no edema  25 minutes spent with patient     Assessment & Plan:  COPD exacerbation-steroid taper. Also antibiotics. Long discussion held with the patient. She does not want to be hospitalized. She states she is trying very hard to hanging in there. She does not want any extra measures done to prolong her life.  After long discussion she feels that it would be best for her to go with hospice care. We will make hospice consultation. She would like to talk with him first before making decisions.

## 2014-07-11 ENCOUNTER — Telehealth: Payer: Self-pay | Admitting: *Deleted

## 2014-07-11 NOTE — Telephone Encounter (Signed)
Called and spoke with Shon HaleMary Beth at hospice about setting up meeting with pt to discuss hospice care. Last office note and history and physcial faxed to [phone].

## 2014-07-20 ENCOUNTER — Other Ambulatory Visit: Payer: Self-pay | Admitting: *Deleted

## 2014-07-20 ENCOUNTER — Telehealth: Payer: Self-pay | Admitting: *Deleted

## 2014-07-20 MED ORDER — LORAZEPAM 2 MG/ML PO CONC: ORAL | Status: AC

## 2014-07-20 NOTE — Telephone Encounter (Signed)
[facility] requesting refill on:  LORazepam (ATIVAN) 2 MG/ML concentrated solution  25 ml every 4 hours prn 30ml  Hospice Patient

## 2014-07-20 NOTE — Telephone Encounter (Signed)
Please go ahead imprint this refill with for additional refills

## 2014-07-20 NOTE — Telephone Encounter (Signed)
rx faxed to [facility] 

## 2014-07-21 IMAGING — CT CT ANGIO CHEST
2 of 4 series · 8 of 36 positions shown · IV contrast (Omnipaque 300)
Comparison: Chest radiograph 09/21/2012 and chest CT 11/07/2010.

CLINICAL DATA: Shortness of breath.  Chest and upper abdominal
pain.  Nausea and fever.

CT ANGIOGRAPHY CHEST
TECHNIQUE: Multidetector CT imaging of the chest using the
standard protocol during bolus administration of intravenous
contrast. Multiplanar reconstructed images including MIPs were
obtained and reviewed to evaluate the vascular anatomy.
Contrast: 100mL OMNIPAQUE IOHEXOL 350 MG/ML SOLN

[Series 4: pe 3.0 b40f · axial · 0.59mm/px · z∈[-346,-187]mm · 5 of 81 slices shown]
[im 14/81  lung]
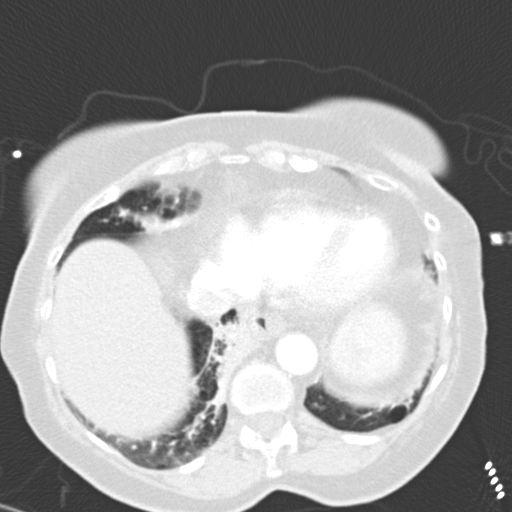
[im 27/81  mediastinal]
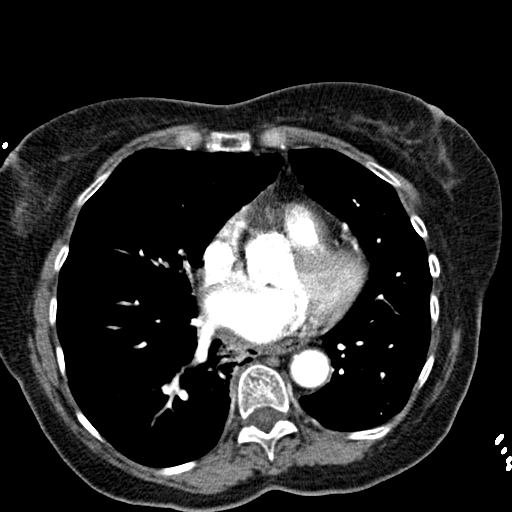
[im 41/81  lung]
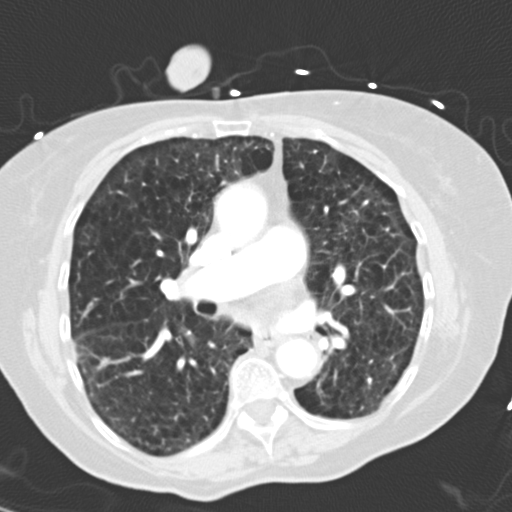
[im 54/81  mediastinal]
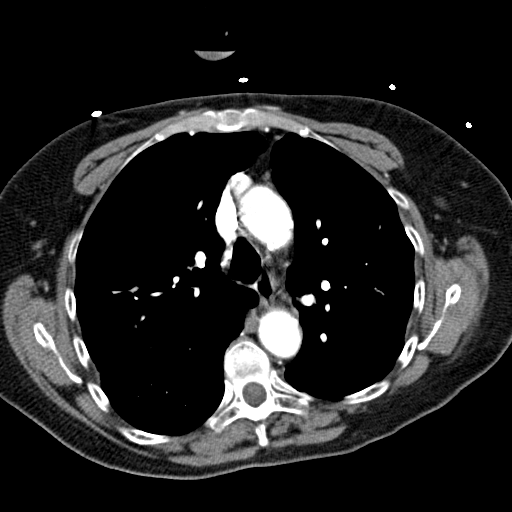
[im 67/81  lung]
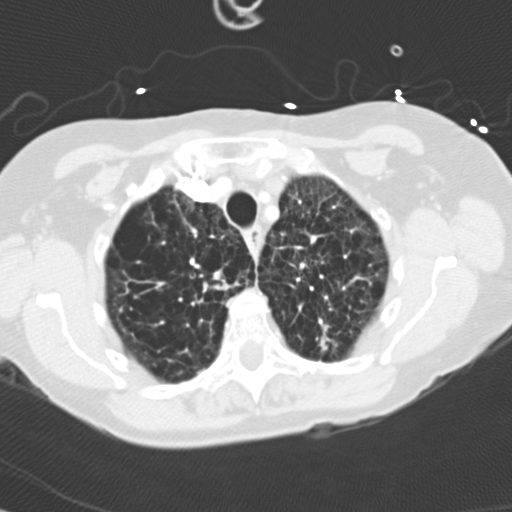

[Series 6: mpr coronal pe 3mm · coronal · 0.48mm/px · 3 of 76 slices shown]
[im 16/76  mediastinal]
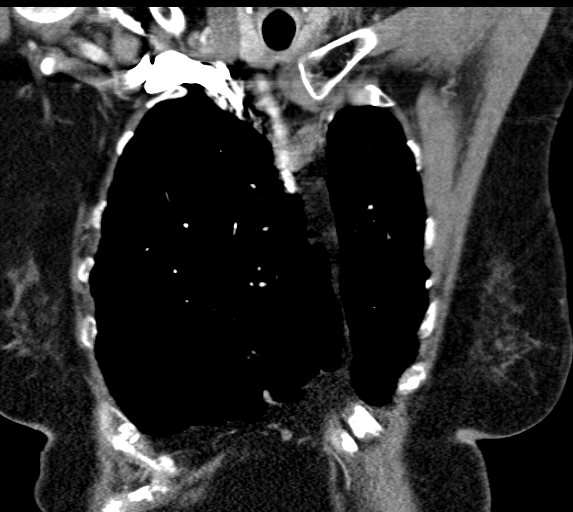
[im 31/76  mediastinal]
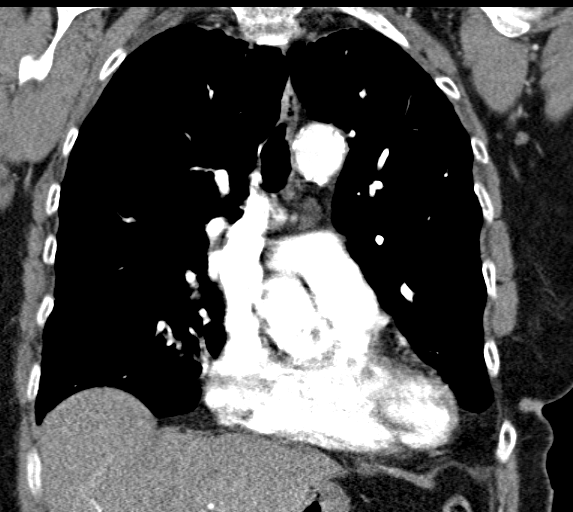
[im 46/76  mediastinal]
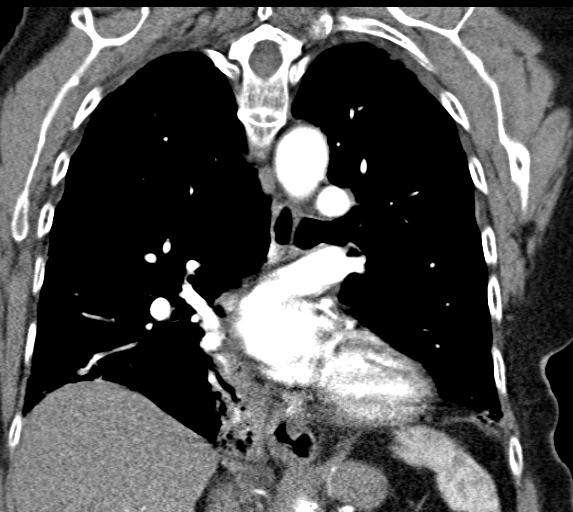

[8 of 36 positions shown; findings below may reference images not displayed]

FINDINGS: No pulmonary embolus.  Mediastinal and hilar lymph nodes
are not enlarged by CT size criteria.  No axillary adenopathy.
Atherosclerotic calcification of the arterial vasculature,
including coronary arteries.  Heart size normal.  No pericardial
effusion.

Severe emphysema.  There are areas of airspace consolidation in the
right middle lobe and right lower lobe.  Findings are worst in the
medial right lower lobe.  4 mm subpleural nodular density in the
right lower lobe (image 60) appears unchanged.  A  3 mm subpleural
nodular density in the inferior right upper lobe (image 50) is not
well seen on the prior exam.  Scattered areas of mucoid impaction.
No pleural fluid.  Airway is unremarkable.

Incidental imaging of the upper abdomen shows mild irregularity of
the liver margin but the liver is incompletely imaged.  Small
hiatal hernia.  No worrisome lytic or sclerotic lesions.
IMPRESSION: 1.  Negative for pulmonary embolus.
2.  Right lower lobe air space consolidation is likely due to
pneumonia.  Follow-up to clearing is recommended.
3.  Tiny right upper lobe nodule is not well seen on the prior
examination. Given risk factors for bronchogenic carcinoma, follow-
up chest CT at 1 year is recommended.  This recommendation follows
the consensus statement:  Guidelines for Management of Small
Pulmonary Nodules Detected on CT Scans:  A Statement from the

## 2014-08-13 DEATH — deceased

## 2014-09-04 ENCOUNTER — Other Ambulatory Visit (HOSPITAL_COMMUNITY): Payer: Medicare Other

## 2014-09-04 ENCOUNTER — Inpatient Hospital Stay (HOSPITAL_COMMUNITY): Admission: RE | Admit: 2014-09-04 | Payer: Medicare Other | Source: Ambulatory Visit
